# Patient Record
Sex: Female | Born: 1985 | Race: Black or African American | Hispanic: No | Smoking: Current every day smoker
Health system: Southern US, Community
[De-identification: ages and names within clinical notes are randomized; demographics above are authoritative.]

## PROBLEM LIST (undated history)

## (undated) ENCOUNTER — Inpatient Hospital Stay (HOSPITAL_COMMUNITY): Payer: Self-pay

## (undated) DIAGNOSIS — B999 Unspecified infectious disease: Secondary | ICD-10-CM

## (undated) HISTORY — PX: NO PAST SURGERIES: SHX2092

---
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
## 2007-02-02 ENCOUNTER — Inpatient Hospital Stay (HOSPITAL_COMMUNITY): Admission: AD | Admit: 2007-02-02 | Discharge: 2007-02-03 | Payer: Self-pay | Admitting: Obstetrics and Gynecology

## 2007-02-03 ENCOUNTER — Inpatient Hospital Stay (HOSPITAL_COMMUNITY): Admission: AD | Admit: 2007-02-03 | Discharge: 2007-02-03 | Payer: Self-pay | Admitting: Obstetrics and Gynecology

## 2007-02-04 ENCOUNTER — Inpatient Hospital Stay (HOSPITAL_COMMUNITY): Admission: AD | Admit: 2007-02-04 | Discharge: 2007-02-06 | Payer: Self-pay | Admitting: Obstetrics and Gynecology

## 2007-02-20 ENCOUNTER — Emergency Department (HOSPITAL_COMMUNITY): Admission: EM | Admit: 2007-02-20 | Discharge: 2007-02-20 | Payer: Self-pay | Admitting: Emergency Medicine

## 2007-08-03 ENCOUNTER — Emergency Department (HOSPITAL_COMMUNITY): Admission: EM | Admit: 2007-08-03 | Discharge: 2007-08-03 | Payer: Self-pay | Admitting: Emergency Medicine

## 2008-03-16 ENCOUNTER — Emergency Department (HOSPITAL_COMMUNITY): Admission: EM | Admit: 2008-03-16 | Discharge: 2008-03-17 | Payer: Self-pay | Admitting: Emergency Medicine

## 2008-06-05 ENCOUNTER — Emergency Department (HOSPITAL_COMMUNITY): Admission: EM | Admit: 2008-06-05 | Discharge: 2008-06-05 | Payer: Self-pay | Admitting: Emergency Medicine

## 2008-08-18 IMAGING — CR DG CERVICAL SPINE COMPLETE 4+V
6 series · 6 of 6 positions shown · non-contrast
Comparison: none

CLINICAL DATA: 21 year-old-female with neck pain and chest pain. 
 CERVICAL SPINE ? 5 VIEW

[view not recorded (1 of 6)]
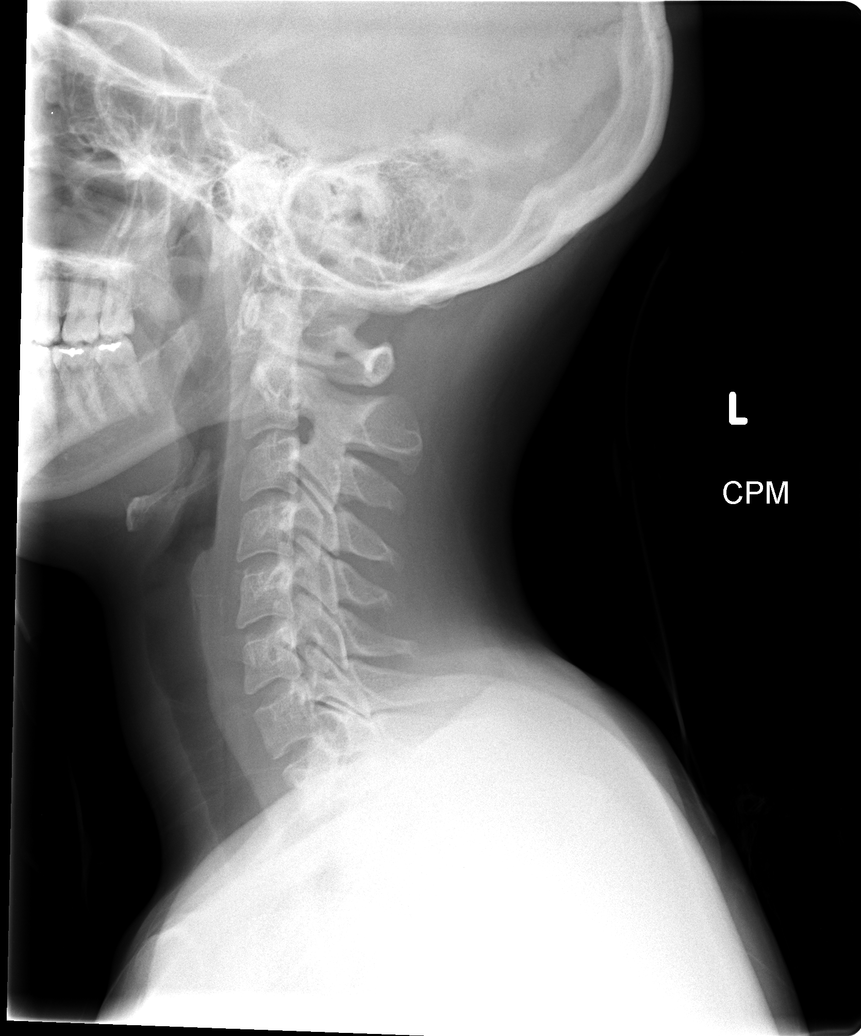

[view not recorded (2 of 6)]
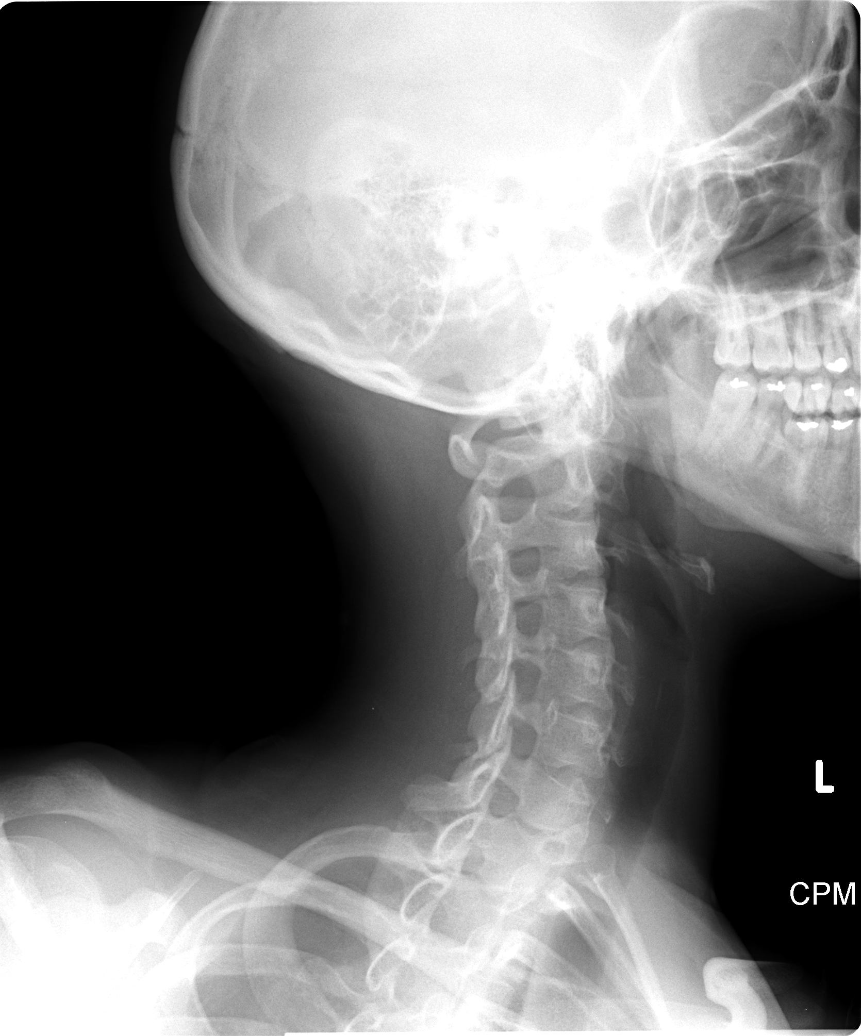

[view not recorded (3 of 6)]
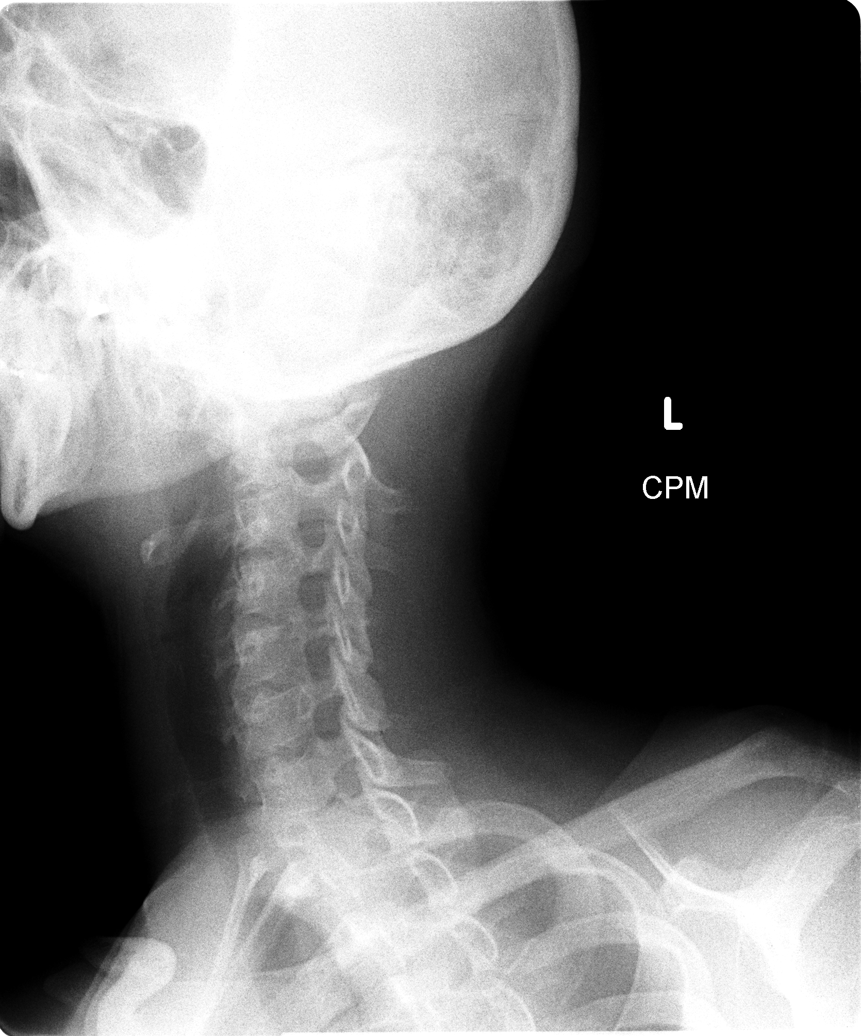

[view not recorded (4 of 6)]
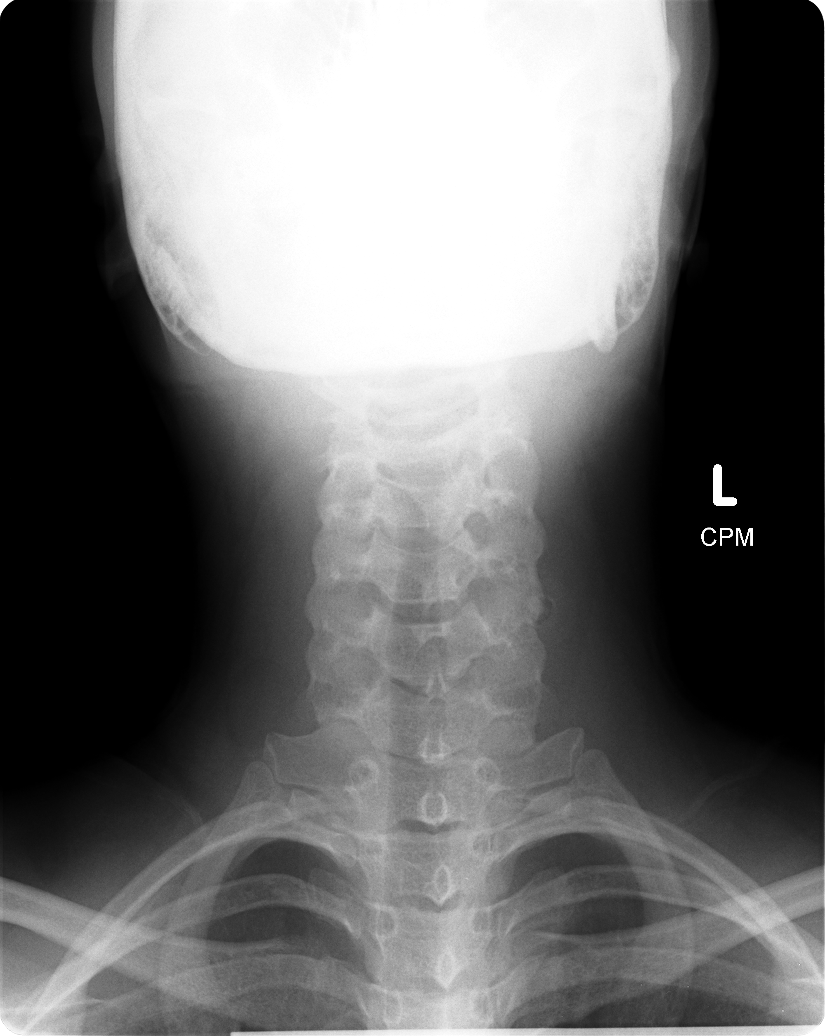

[view not recorded (5 of 6)]
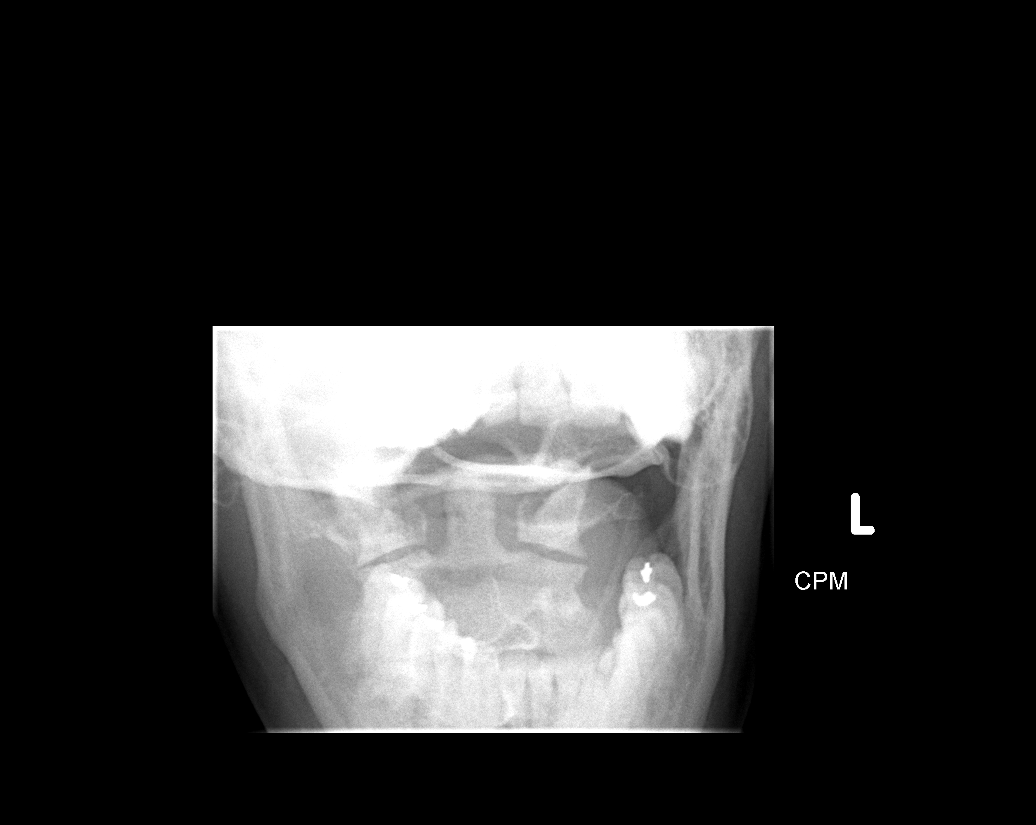

[view not recorded (6 of 6)]
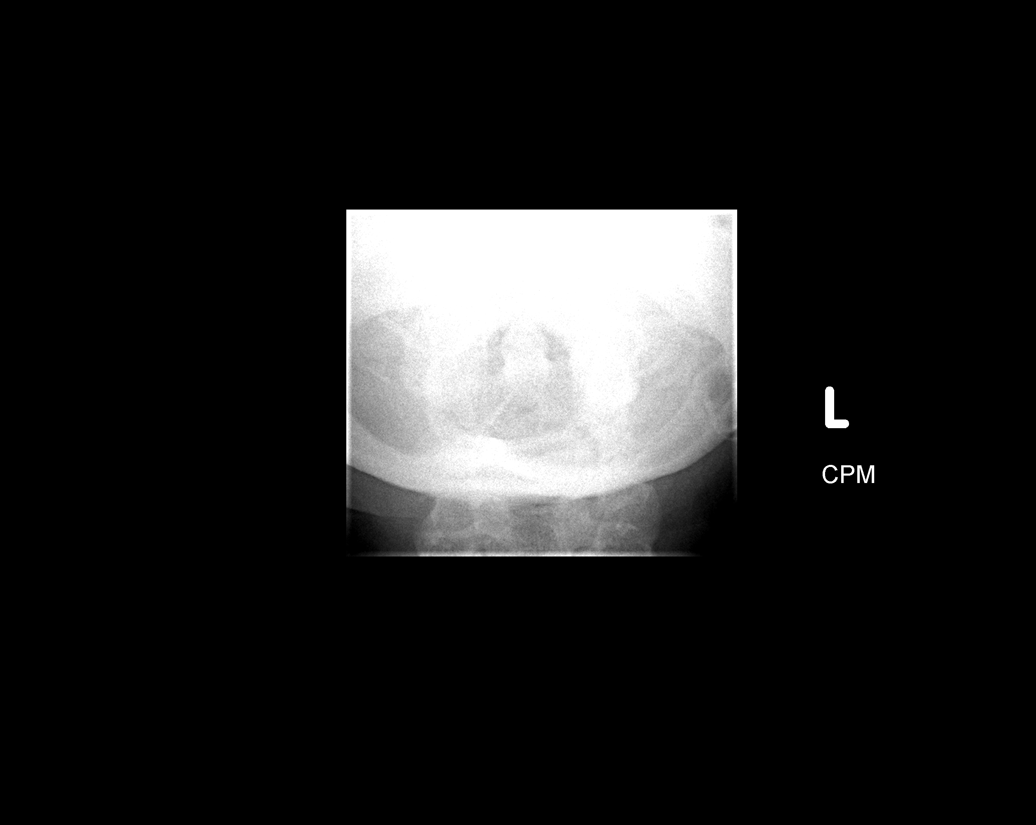

[6 of 6 positions shown; findings below may reference images not displayed]

FINDINGS: There is no evidence of cervical spine fracture or prevertebral soft tissue swelling.  Alignment is normal.  No other significant bone abnormalities are identified.
IMPRESSION: Negative cervical spine radiographs.

## 2008-11-04 ENCOUNTER — Emergency Department (HOSPITAL_COMMUNITY): Admission: EM | Admit: 2008-11-04 | Discharge: 2008-11-04 | Payer: Self-pay | Admitting: Physician Assistant

## 2008-11-08 ENCOUNTER — Emergency Department (HOSPITAL_COMMUNITY): Admission: EM | Admit: 2008-11-08 | Discharge: 2008-11-08 | Payer: Self-pay | Admitting: Emergency Medicine

## 2009-01-02 ENCOUNTER — Inpatient Hospital Stay (HOSPITAL_COMMUNITY): Admission: AD | Admit: 2009-01-02 | Discharge: 2009-01-02 | Payer: Self-pay | Admitting: Obstetrics and Gynecology

## 2009-08-01 ENCOUNTER — Inpatient Hospital Stay (HOSPITAL_COMMUNITY): Admission: AD | Admit: 2009-08-01 | Discharge: 2009-08-04 | Payer: Self-pay | Admitting: Obstetrics and Gynecology

## 2010-10-12 ENCOUNTER — Inpatient Hospital Stay (HOSPITAL_COMMUNITY): Admission: AD | Admit: 2010-10-12 | Discharge: 2010-10-12 | Payer: Self-pay | Admitting: Anesthesiology

## 2011-02-28 LAB — URINALYSIS, ROUTINE W REFLEX MICROSCOPIC
Ketones, ur: NEGATIVE mg/dL
Protein, ur: NEGATIVE mg/dL
Urobilinogen, UA: 0.2 mg/dL (ref 0.0–1.0)

## 2011-02-28 LAB — DIFFERENTIAL
Basophils Absolute: 0 10*3/uL (ref 0.0–0.1)
Eosinophils Relative: 1 % (ref 0–5)
Lymphocytes Relative: 22 % (ref 12–46)
Monocytes Absolute: 0.4 10*3/uL (ref 0.1–1.0)
Monocytes Relative: 7 % (ref 3–12)
Neutro Abs: 4.3 10*3/uL (ref 1.7–7.7)

## 2011-02-28 LAB — TYPE AND SCREEN
ABO/RH(D): B POS
Antibody Screen: NEGATIVE

## 2011-02-28 LAB — URINE MICROSCOPIC-ADD ON

## 2011-02-28 LAB — RUBELLA SCREEN: Rubella: 27.8 IU/mL — ABNORMAL HIGH

## 2011-02-28 LAB — RPR: RPR Ser Ql: NONREACTIVE

## 2011-02-28 LAB — CBC
HCT: 32.3 % — ABNORMAL LOW (ref 36.0–46.0)
Hemoglobin: 11.1 g/dL — ABNORMAL LOW (ref 12.0–15.0)
MCHC: 34.5 g/dL (ref 30.0–36.0)
MCV: 94.9 fL (ref 78.0–100.0)
RDW: 12.9 % (ref 11.5–15.5)
WBC: 6.1 10*3/uL (ref 4.0–10.5)

## 2011-02-28 LAB — GC/CHLAMYDIA PROBE AMP, GENITAL: Chlamydia, DNA Probe: NEGATIVE

## 2011-03-24 LAB — CBC
HCT: 36.1 % (ref 36.0–46.0)
Hemoglobin: 11.2 g/dL — ABNORMAL LOW (ref 12.0–15.0)
Hemoglobin: 12.4 g/dL (ref 12.0–15.0)
MCHC: 33.3 g/dL (ref 30.0–36.0)
MCHC: 33.7 g/dL (ref 30.0–36.0)
MCV: 84.5 fL (ref 78.0–100.0)
MCV: 85.2 fL (ref 78.0–100.0)
Platelets: 173 10*3/uL (ref 150–400)
RBC: 3.94 MIL/uL (ref 3.87–5.11)
RBC: 4.14 MIL/uL (ref 3.87–5.11)
RBC: 4.28 MIL/uL (ref 3.87–5.11)
RBC: 4.4 MIL/uL (ref 3.87–5.11)
RDW: 15 % (ref 11.5–15.5)
RDW: 15.9 % — ABNORMAL HIGH (ref 11.5–15.5)
WBC: 10.7 10*3/uL — ABNORMAL HIGH (ref 4.0–10.5)
WBC: 12.2 10*3/uL — ABNORMAL HIGH (ref 4.0–10.5)
WBC: 7.7 10*3/uL (ref 4.0–10.5)

## 2011-03-24 LAB — URINE MICROSCOPIC-ADD ON

## 2011-03-24 LAB — COMPREHENSIVE METABOLIC PANEL
ALT: 11 U/L (ref 0–35)
ALT: 14 U/L (ref 0–35)
ALT: 19 U/L (ref 0–35)
ALT: 24 U/L (ref 0–35)
AST: 31 U/L (ref 0–37)
AST: 39 U/L — ABNORMAL HIGH (ref 0–37)
Albumin: 2.5 g/dL — ABNORMAL LOW (ref 3.5–5.2)
Alkaline Phosphatase: 152 U/L — ABNORMAL HIGH (ref 39–117)
BUN: 3 mg/dL — ABNORMAL LOW (ref 6–23)
CO2: 22 mEq/L (ref 19–32)
CO2: 22 mEq/L (ref 19–32)
Calcium: 6.1 mg/dL — CL (ref 8.4–10.5)
Calcium: 8.3 mg/dL — ABNORMAL LOW (ref 8.4–10.5)
Chloride: 107 mEq/L (ref 96–112)
Chloride: 109 mEq/L (ref 96–112)
Creatinine, Ser: 0.75 mg/dL (ref 0.4–1.2)
GFR calc Af Amer: >60 mL/min (ref >60)
GFR calc non Af Amer: >60 mL/min (ref >60)
GFR calc non Af Amer: >60 mL/min (ref >60)
Glucose, Bld: 72 mg/dL (ref 70–99)
Glucose, Bld: 75 mg/dL (ref 70–99)
Glucose, Bld: 98 mg/dL (ref 70–99)
Potassium: 4.7 mEq/L (ref 3.5–5.1)
Sodium: 135 mEq/L (ref 135–145)
Sodium: 135 mEq/L (ref 135–145)
Sodium: 136 mEq/L (ref 135–145)
Sodium: 136 mEq/L (ref 135–145)
Total Bilirubin: 0.3 mg/dL (ref 0.3–1.2)
Total Protein: 5.8 g/dL — ABNORMAL LOW (ref 6.0–8.3)
Total Protein: 5.8 g/dL — ABNORMAL LOW (ref 6.0–8.3)

## 2011-03-24 LAB — URIC ACID
Uric Acid, Serum: 7.9 mg/dL — ABNORMAL HIGH (ref 2.4–7.0)
Uric Acid, Serum: 8.3 mg/dL — ABNORMAL HIGH (ref 2.4–7.0)
Uric Acid, Serum: 8.4 mg/dL — ABNORMAL HIGH (ref 2.4–7.0)

## 2011-03-24 LAB — URINALYSIS, ROUTINE W REFLEX MICROSCOPIC
Glucose, UA: NEGATIVE mg/dL
Nitrite: NEGATIVE
Specific Gravity, Urine: 1.025 (ref 1.005–1.030)
pH: 6.5 (ref 5.0–8.0)

## 2011-03-24 LAB — LACTATE DEHYDROGENASE
LDH: 230 U/L (ref 94–250)
LDH: 321 U/L — ABNORMAL HIGH (ref 94–250)

## 2011-03-24 LAB — RPR: RPR Ser Ql: NONREACTIVE

## 2011-04-02 LAB — CBC
MCHC: 34 g/dL (ref 30.0–36.0)
MCV: 89.6 fL (ref 78.0–100.0)
RDW: 13.1 % (ref 11.5–15.5)

## 2011-04-02 LAB — ABO/RH: ABO/RH(D): B POS

## 2011-04-02 LAB — HCG, QUANTITATIVE, PREGNANCY: hCG, Beta Chain, Quant, S: 42353 m[IU]/mL — ABNORMAL HIGH (ref <5)

## 2011-05-01 NOTE — Discharge Summary (Signed)
[name], Selena Kaiser NO.:  [id number]   MEDICAL RECORD NO.:  [id number]          PATIENT TYPE:  INP   LOCATION:  9101                          [facility]   PHYSICIAN:  Huel Cote, M.D. DATE OF BIRTH:  Apr 22, 1986   DATE OF ADMISSION:  08/01/2009  DATE OF DISCHARGE:  08/04/2009                               DISCHARGE SUMMARY   DISCHARGE DIAGNOSES:  1. Preterm pregnancy at 36-1/7th weeks, delivered.  2. Severe preeclampsia.  3. Status post normal spontaneous vaginal delivery precipitous.   DISCHARGE MEDICATIONS:  1. Labetalol 200 mg p.o. twice daily.  2. Prenatal vitamins 1 p.o. daily.  3. Motrin 600 mg p.o. every 6 hours p.r.n.   DISCHARGE FOLLOWUP:  The patient plans to follow up in stated wherever  [place]   HOSPITAL COURSE:  The patient is a 25 year old, G2, P 1-0-0-1, who came  2 maternity admissions at 36 weeks and 1/7th days with estimated due  date of August 29, 2009, by a 6-week ultrasound.  She had come in for  some concerns of swelling.  She was visiting from [place] for a  baby shower and had not been seen for prenatal care in [place] since  June 2010.  Upon presentation, the patient was noted to have excessively  elevated blood pressures 170-200 over 119-121 and 3+ proteinuria  consistent with severe preeclampsia.  She denied any headaches or vision  changes or other symptoms.  As stated, her prenatal care had been  somewhat scanty.  She had 4 visits with our office from 22-28 weeks and  then reported that she moved to [place] and would receive care  there.  She has not seen a doctor in [place] since her leaving  [place] in mid June 2010.  The prenatal care that she did have had no  significant issues.   PRENATAL LABORATORY DATA:  B positive, antibody negative, rubella  immune, RPR nonreactive, HIV negative, GC negative, she was chlamydia  positive, but was treated with  a negative test of cure.  1-hour Glucola  was 92.  She was too far along for any genetic screens upon presentation  and her group B strep was unknown.   PAST OBSTETRICAL HISTORY:  In 2008, she had a vaginal delivery of a 5  pounds 13 ounces infant.   PAST GYN HISTORY:  History of chlamydia as stated and as well as  Trichomonas, which were both treated with negative test of cures.   PAST SURGICAL HISTORY:  None.   PAST MEDICAL HISTORY:  None.   ALLERGIES:  None.   MEDICATIONS:  None.   PHYSICAL EXAMINATION:  VITAL SIGNS:  On admission, as stated her blood  pressures were elevated at 170-119.  Fetal heart rate was reactive.  CARDIAC:  Regular rate and rhythm.  LUNGS:  Clear.  ABDOMEN:  Gravid and nontender.  Cervix was 53 and -2 station.  She had  2-3 plus edema and 2+ DTRs bilaterally.   Labs were performed with a uric acid noted at 8.3, otherwise LFTs were  normal at  29 and 11, AST and ALT respectively.  Creatinine was 0.75 and  her platelets were 174.  She was treated with IV labetalol and her blood  pressures improved to diastolics of 90-100.  At that point, Cervix was  53 and -2.  Rupture of membranes performed with clear fluid noted and  she was placed on magnesium and Pitocin as well as penicillin for an  unknown Group B strep status.  Within 4 hours of rupture of membranes,  the patient received an epidural went from 5 cm to 10 cm and delivery  within about 15 minutes and I was called for delivery, as the patient  delivered with nursing staff in attendance.  The baby did quite well.  Apgars were 8 and 9, weight was 5 pounds 3 ounces.  The midwife was able  to enter the room shortly after delivery, while I was arriving at the  hospital, and the placenta delivered spontaneously and the cervix,  vagina, and perineum were all intact.  Estimated blood loss was 300 mL.  The patient was stable at that point with blood pressures 140/95 on her  magnesium and was left on her  continuous infusion.  Overnight, she did  have some elevated blood pressures up to 160/126, which were covered  with IV labetalol.  The following morning, she was diuresing extremely  well with over 5000 mL in that shift.  She was changed to p.o. labetalol  200 mg p.o. twice daily and was continued on her magnesium for a full 24  hours.  This was then discontinued and she continued to diurese very  well.  Her LFTs did bump slightly to 53 and 24 AST and ALT and her  platelets were 145 on postpartum day #2.  By postpartum day #3, her  blood pressures were stable at 140s over 90s on her p.o. labetalol.  She  was doing quite well.  Her pain was well controlled.  Bleeding was  normal.  Platelets were stable on day of discharge at 144 and LFTs had  begun to normalize at 32 and 12.  Therefore, she was felt stable for  discharge home.  She was discharged on her p.o. labetalol as stated.  Careful instructions were given to the patient regarding her need for  followup of blood pressures to wean off her blood pressure medicine.  She was advised to see someone within 2 weeks of discharge to be  evaluated and she stated that she would do this in [place].  I  advised her to get a copy of her discharge summary to assist those  physicians in her care and she stated she would do this prior to  returning home at the end of the week.      Huel Cote, M.D.  Electronically Signed     KR/MEDQ  D:  08/04/2009  T:  08/04/2009  Job:  [id number]

## 2011-05-04 NOTE — Discharge Summary (Signed)
NAME:  Selena, Kaiser NO.:  [id number]   MEDICAL RECORD NO.:  [id number]          PATIENT TYPE:  INP   LOCATION:  9143                          [facility]ILITY:  [facility]   PHYSICIAN:  Sherron Monday, MD        DATE OF BIRTH:  [birth year]   DATE OF ADMISSION:  02/04/2007  DATE OF DISCHARGE:  02/06/2007                               DISCHARGE SUMMARY   ADMISSION DIAGNOSIS:  Intrauterine pregnancy at term, early labor.   DISCHARGE DIAGNOSIS:  Intrauterine pregnancy at term, early labor,  delivered by spontaneous vaginal delivery.   HISTORY OF PRESENT ILLNESS:  Selena Kaiser is a 25 year old, G1 at 37-3/7  weeks who presents with contractions that have been increasing in  intensity and frequency. She states that she has good fetal movement, no  loss of fluid, no vaginal bleeding and is having contractions at least  every 5 minutes that are taking her breath away and making her hurt. Her  EDC is 02/22/07.  She was evaluated last evening in the maternity  admissions unit and had reactive tracing and regular contractions,  however, no cervical change. She was discharged to home at this time and  she has returned early in the morning of the 19th.   PAST MEDICAL HISTORY:  Not significant.   PAST SURGICAL HISTORY:  Not significant.   PAST OB/GYN HISTORY:  GYN significant for present pregnancy, no  complications. She has no history of abnormal pap smears. She has a  history of Chlamydia with this pregnancy.   MEDICATIONS:  Prenatal vitamins and iron.   ALLERGIES:  No known drug allergies.   SOCIAL HISTORY:  The patient denies alcohol, tobacco or drug use. She is  single.   FAMILY HISTORY:  Significant for coronary artery disease in maternal  grandfather.  Diabetes in maternal grandfather as well as end-stage  renal disease, hypertension in maternal grandfather, mother and brother.   PRENATAL LABORATORY DATA:  Hemoglobin 10.4, platelet count 363,000, B  positive, antibody screen  negative.  Sickle cell is within normal  limits. Gonorrhea negative. Chlamydia positive with several tests of  cure that were positive and she was recently treated again. She states  that since this time she has not been sexually active. RPR nonreactive,  rubella immune, hepatitis B surface antigen negative, HIV negative.  Glucola 95.  Group B Streptococcus was positive.  AFP was within normal  limits.  GBBS negative. Ultrasound performed on 10/28/06 at 23 weeks and  two days revealed an normal anatomy, anterior placenta and a female  infant. At admission her examination was benign.  Fetal heart tones in  the 140s to 150s and reactive contractions were 2-4 minutes. Vaginal  examination has changed from 1 to 2, 80 and -2 to 3 cm, 80, and -2.  She  progressed rapidly to complete labor, pushed well to deliver a viable  female infant at 7:44 on the morning of the 19th with Apgar's of 7 at  one minute and 9 at five minutes, weight of 5 pounds 13 ounces. The  infant was delivered OA to ROT.  Placenta was manually extracted at  7:48.  With the delivery she had bilateral labial lacerations as well as  a periclitoral laceration and a first degree perineal laceration. These  were all repaired with 3-0 Vicryl.  EBL was less than 500 cc.  She will  get a dose of Cefotetan secondary to the manual extraction of the  placenta.  Postpartum course was relatively uncomplicated. She remained  afebrile with stable vital signs throughout.  On postpartum day two, she  had no complaints, had normal lochia and her pain was well controlled.  She was ambulating well and voiding without difficulty. She was  discharged to home at this time. Her hemoglobin had decreased from 11.9  to 10.8. She was discharged to home with Motrin, Vicodin and prenatal  vitamins as well as routine discharge instructions and the number to  call for any questions or problems. She will be following up with [place] in  six weeks.   DISCHARGE  INFORMATION:  B positive, rubella immune, hemoglobin 11.9 to  10.8.  She plans to bottle feed and will start either pills or  __________ at six weeks check up.      Sherron Monday, MD  Electronically Signed     JB/MEDQ  D:  02/06/2007  T:  02/06/2007  Job:  [id number]

## 2011-09-11 LAB — POCT PREGNANCY, URINE: Preg Test, Ur: NEGATIVE

## 2011-09-11 LAB — COMPREHENSIVE METABOLIC PANEL
ALT: 19
AST: 22
Albumin: 3.6
Alkaline Phosphatase: 71
CO2: 23
Chloride: 107
Creatinine, Ser: 0.62
GFR calc Af Amer: >60
GFR calc non Af Amer: >60
Potassium: 3.5
Sodium: 138
Total Bilirubin: 0.4

## 2011-09-11 LAB — DIFFERENTIAL
Basophils Absolute: 0
Basophils Relative: 0
Eosinophils Absolute: 0.1
Eosinophils Relative: 2
Lymphocytes Relative: 48 — ABNORMAL HIGH
Monocytes Absolute: 0.3

## 2011-09-11 LAB — URINALYSIS, ROUTINE W REFLEX MICROSCOPIC
Bilirubin Urine: NEGATIVE
Hgb urine dipstick: NEGATIVE
Ketones, ur: NEGATIVE
Nitrite: NEGATIVE
Urobilinogen, UA: 1
pH: 7

## 2011-09-11 LAB — CBC
MCV: 86.2
Platelets: 252
RBC: 4.27
WBC: 3.9 — ABNORMAL LOW

## 2011-09-11 LAB — URINE MICROSCOPIC-ADD ON

## 2012-12-17 NOTE — L&D Delivery Note (Signed)
Delivery Note Pt progressed rapidly to complete dilation.  At 11:50 AM a healthy female was delivered via Vaginal, Spontaneous Delivery (Presentation:LOA).  APGAR: 9, 9; weight 5 lb 14.4 oz (2675 g).   Placenta status: Intact, Spontaneous.  Cord: 3 vessels with the following complications: nuchal cord delivered through. Anesthesia: Epidural  Episiotomy: None Lacerations: None Suture Repair: N/A Est. Blood Loss (mL): 350cc  Mom to postpartum.  Baby to stay with mother. D/w pt circumcision risks and benefits and she would like to proceed.  D/w pt unable to get [employer] to give authorization for tubal.  She would like to eat anyway and is amenable to doing an Essure postpartum as long as she gets Depo-provera prior to d/c.  We will set that up instead of the pp BTL.  Oliver Pila 05/13/2013, 12:11 PM

## 2013-03-12 ENCOUNTER — Encounter (HOSPITAL_COMMUNITY): Payer: Self-pay | Admitting: *Deleted

## 2013-03-12 ENCOUNTER — Inpatient Hospital Stay (HOSPITAL_COMMUNITY)
Admission: AD | Admit: 2013-03-12 | Discharge: 2013-03-12 | Disposition: A | Discharge status: Home / Self Care | Source: Ambulatory Visit | Attending: Obstetrics and Gynecology | Admitting: Obstetrics and Gynecology

## 2013-03-12 DIAGNOSIS — Z3201 Encounter for pregnancy test, result positive: Secondary | ICD-10-CM | POA: Insufficient documentation

## 2013-03-12 LAB — POCT PREGNANCY, URINE: Preg Test, Ur: POSITIVE — AB

## 2013-03-12 NOTE — MAU Note (Addendum)
Pt refuses lab draw. Pt states that she only wants know how far a long she is. Friend @ bedside states that the patient is planning on ending the pregnancy.

## 2013-03-12 NOTE — MAU Provider Note (Signed)
  History     CSN: [id number]  Arrival date and time: 03/12/13 1834   None     Chief Complaint  Patient presents with  . Possible Pregnancy   HPI  Selena Kaiser is a 27 y.o. [id number] who presents today to verify if she is in fact pregnant and to know how far along she is. She denies any pain or vaginal bleeding. LMP: 12/05/12.   No past medical history on file.  No past surgical history on file.  No family history on file.  History  Substance Use Topics  . Smoking status: Current Every Day Smoker -- 1.00 packs/day    Types: Cigarettes  . Smokeless tobacco: Not on file  . Alcohol Use: No    Allergies: No Known Allergies  No prescriptions prior to admission    Review of Systems  Constitutional: Negative for fever and chills.  Eyes: Negative for blurred vision.  Gastrointestinal: Negative for nausea, vomiting, abdominal pain, diarrhea and constipation.  Genitourinary: Negative for dysuria, urgency and frequency.  Musculoskeletal: Negative for myalgias.  Neurological: Negative for dizziness and headaches.   Physical Exam   Blood pressure 127/82, pulse 82, temperature 98.2 F (36.8 C), temperature source Oral, resp. rate 16, height 5' 3.5" (1.613 m), weight 63.141 kg (139 lb 3.2 oz), last menstrual period 12/05/2012, SpO2 100.00%.  Physical Exam  Nursing note and vitals reviewed. Constitutional: She is oriented to person, place, and time. She appears well-developed and well-nourished. No distress.  Cardiovascular: Normal rate.   Respiratory: Effort normal.  GI: Soft.  Genitourinary:  Pt refuses pelvic exam today. She just wants a letter for verification of pregnancy.   Neurological: She is alert and oriented to person, place, and time.  Skin: Skin is warm and dry.  Psychiatric: She has a normal mood and affect.    MAU Course  Procedures  Results for orders placed during the hospital encounter of 03/12/13 (from the past 24 hour(s))  POCT PREGNANCY, URINE      Status: Abnormal   Collection Time    03/12/13  7:03 PM      Result Value Range   Preg Test, Ur POSITIVE (*) NEGATIVE   Assessment and Plan   1. Pregnancy test performed, pregnancy confirmed    Pregnancy verification letter given Danger signs reviewed, knows she can return to MAU if needed.   Tawnya Crook 03/12/2013, 8:23 PM

## 2013-03-12 NOTE — MAU Note (Signed)
Pt reports that she had a period in December, but not in January. Pt then reports heavy bleeding in February. Pt took a home pregnancy test in January and it was positive. Pt denies pain or bleeding at this time.

## 2013-03-12 NOTE — MAU Note (Signed)
Patient states she has had some spotting off and on, none now. Denies pain. Would like to have a pregnancy test.

## 2013-03-13 NOTE — MAU Provider Note (Signed)
Attestation of Attending Supervision of Advanced Practitioner (CNM/NP): Evaluation and management procedures were performed by the Advanced Practitioner under my supervision and collaboration.  I have reviewed the Advanced Practitioner's note and chart, and I agree with the management and plan.  Emiya Loomer 03/13/2013 8:10 AM

## 2013-03-23 ENCOUNTER — Encounter (HOSPITAL_COMMUNITY): Payer: Self-pay | Admitting: *Deleted

## 2013-03-23 ENCOUNTER — Inpatient Hospital Stay (HOSPITAL_COMMUNITY)

## 2013-03-23 ENCOUNTER — Inpatient Hospital Stay (HOSPITAL_COMMUNITY)
Admission: AD | Admit: 2013-03-23 | Discharge: 2013-03-23 | Disposition: A | Discharge status: Home / Self Care | Source: Ambulatory Visit | Attending: Obstetrics & Gynecology | Admitting: Obstetrics & Gynecology

## 2013-03-23 DIAGNOSIS — R1032 Left lower quadrant pain: Secondary | ICD-10-CM | POA: Insufficient documentation

## 2013-03-23 DIAGNOSIS — O26899 Other specified pregnancy related conditions, unspecified trimester: Secondary | ICD-10-CM

## 2013-03-23 DIAGNOSIS — O99891 Other specified diseases and conditions complicating pregnancy: Secondary | ICD-10-CM | POA: Insufficient documentation

## 2013-03-23 DIAGNOSIS — R109 Unspecified abdominal pain: Secondary | ICD-10-CM

## 2013-03-23 DIAGNOSIS — B9689 Other specified bacterial agents as the cause of diseases classified elsewhere: Secondary | ICD-10-CM

## 2013-03-23 HISTORY — DX: Unspecified infectious disease: B99.9

## 2013-03-23 LAB — WET PREP, GENITAL
Trich, Wet Prep: NONE SEEN
Yeast Wet Prep HPF POC: NONE SEEN

## 2013-03-23 LAB — URINALYSIS, ROUTINE W REFLEX MICROSCOPIC
Glucose, UA: NEGATIVE mg/dL
Hgb urine dipstick: NEGATIVE
Specific Gravity, Urine: 1.02 (ref 1.005–1.030)
pH: 7.5 (ref 5.0–8.0)

## 2013-03-23 LAB — URINE MICROSCOPIC-ADD ON

## 2013-03-23 MED ORDER — METRONIDAZOLE 500 MG PO TABS: 500.0000 mg | ORAL_TABLET | Freq: Two times a day (BID) | ORAL | Status: DC

## 2013-03-23 MED ORDER — ACETAMINOPHEN 500 MG PO TABS
1000.0000 mg | ORAL_TABLET | Freq: Four times a day (QID) | ORAL | Status: DC | PRN
  Administered 2013-03-23: 1000 mg via ORAL
  Filled 2013-03-23: qty 2

## 2013-03-23 NOTE — MAU Provider Note (Signed)
Attestation of Attending Supervision of Advanced Practitioner (CNM/NP): Evaluation and management procedures were performed by the Advanced Practitioner under my supervision and collaboration.  I have reviewed the Advanced Practitioner's note and chart, and I agree with the management and plan.  HARRAWAY-SMITH, Daneka Lantigua 10:05 PM     

## 2013-03-23 NOTE — MAU Provider Note (Signed)
History     CSN: [id number]  Arrival date and time: 03/23/13 1519   None     Chief Complaint  Patient presents with  . Abdominal Pain   HPI 27 y.o. [id number] at Unknown with pain all over abdomen, achy. Started last night on LLQ while lying down. Constant. Nothing makes it better or worse. No nausea, vomiting, diarrhea, constipation. No fever/chills. No pain with urination. No vaginal bleeding. Had watery discharge last night, felt like liquid flowing out but did not look at it, nothing on underwear or bedsheets.  LMP  Unknown. Seen in MAU 03/12/13 but wasn't sure she wanted to keep baby at that time.    OB History   Grav Para Term Preterm Abortions TAB SAB Ect Mult Living   5 4 4       4     All vaginal deliveries.   Past Medical History  Diagnosis Date  . Infection     UTI    Past Surgical History  Procedure Laterality Date  . No past surgeries      Family History  Problem Relation Age of Onset  . Hypertension Mother   . Cancer Maternal Aunt     lung  . Hypertension Maternal Grandmother   . Cancer Maternal Grandmother     lung  . Cancer Paternal Grandmother     History  Substance Use Topics  . Smoking status: Current Every Day Smoker -- 0.25 packs/day for 4 years    Types: Cigarettes  . Smokeless tobacco: Not on file  . Alcohol Use: No    Allergies: No Known Allergies  No prescriptions prior to admission    Review of Systems  Constitutional: Negative for fever and chills.  Eyes: Negative for blurred vision and double vision.  Respiratory: Negative for shortness of breath.   Cardiovascular: Negative for chest pain.  Gastrointestinal: Positive for abdominal pain. Negative for heartburn, nausea, vomiting, diarrhea and constipation.  Genitourinary: Negative for dysuria and urgency.  Musculoskeletal: Negative for back pain.  Neurological: Negative for dizziness and headaches.   Physical Exam   Blood pressure 132/91, pulse 88, temperature 98 F (36.7  C), temperature source Oral, resp. rate 16, height 5\' 4"  (1.626 m), weight 63.866 kg (140 lb 12.8 oz).  Physical Exam  Constitutional: She is oriented to person, place, and time. She appears well-developed and well-nourished. No distress.  HENT:  Head: Normocephalic and atraumatic.  Eyes: Conjunctivae and EOM are normal.  Neck: Normal range of motion. Neck supple.  Cardiovascular: Normal rate, regular rhythm and normal heart sounds.   Respiratory: Breath sounds normal. She is in respiratory distress.  GI: Soft. Bowel sounds are normal. She exhibits no distension. There is no rebound and no guarding.  Gravid, FH about 24-25 cm. Diffuse mild tenderness. No guarding or rebound.  Musculoskeletal: Normal range of motion. She exhibits no edema and no tenderness.  Neurological: She is alert and oriented to person, place, and time.  Skin: Skin is warm and dry.  Psychiatric: She has a normal mood and affect.   FHTs:   140, moderate variability, accles present, no decels TOCO:  Occasional cts.  Results for orders placed during the hospital encounter of 03/23/13 (from the past 24 hour(s))  URINALYSIS, ROUTINE W REFLEX MICROSCOPIC     Status: Abnormal   Collection Time    03/23/13  3:40 PM      Result Value Range   Color, Urine YELLOW  YELLOW   APPearance HAZY (*) CLEAR  Specific Gravity, Urine 1.020  1.005 - 1.030   pH 7.5  5.0 - 8.0   Glucose, UA NEGATIVE  NEGATIVE mg/dL   Hgb urine dipstick NEGATIVE  NEGATIVE   Bilirubin Urine NEGATIVE  NEGATIVE   Ketones, ur 40 (*) NEGATIVE mg/dL   Protein, ur NEGATIVE  NEGATIVE mg/dL   Urobilinogen, UA 1.0  0.0 - 1.0 mg/dL   Nitrite NEGATIVE  NEGATIVE   Leukocytes, UA LARGE (*) NEGATIVE  URINE MICROSCOPIC-ADD ON     Status: Abnormal   Collection Time    03/23/13  3:40 PM      Result Value Range   Squamous Epithelial / LPF MANY (*) RARE   WBC, UA 21-50  <3 WBC/hpf   Bacteria, UA MANY (*) RARE  WET PREP, GENITAL     Status: Abnormal   Collection  Time    03/23/13  4:50 PM      Result Value Range   Yeast Wet Prep HPF POC NONE SEEN  NONE SEEN   Trich, Wet Prep NONE SEEN  NONE SEEN   Clue Cells Wet Prep HPF POC FEW (*) NONE SEEN   WBC, Wet Prep HPF POC MANY (*) NONE SEEN   ULTRASOUND  LIMITED OBSTETRIC ULTRASOUND  Number of Fetuses: 1  Heart Rate: 140 bpm  Movement: Yes.  Presentation: Cephalic.  Placental Location: Anterior.  Previa: No.  Amniotic Fluid (Subjective): Normal.  Vertical Pocket 6.2 cm  BPD: 6.8 cm 27 w 4 d EDC: 06/18/2013.  MATERNAL FINDINGS:  Cervix: Closed.  Uterus/Adnexae: No acute findings.  IMPRESSION:  1. Single living intrauterine pregnancy without complicating  feature.  2. In this patient with no prenatal care and unsure of dates,  gestational age is approximately 27 weeks 4 days with an estimated  date of confinement of 06/18/2013.   MAU Course  Procedures   Assessment and Plan  27 y.o. [id number] at unknown GA with abdominal pain - no acute process on ultrasound - pain improved with tylenol - FHTs reactive - f/u with [facility] to establish care  Napoleon Form 03/23/2013, 4:28 PM

## 2013-03-23 NOTE — MAU Note (Signed)
Patient states she has been having abdominal pain all over the abdomen since last night but worse today. States she has been feeling fetal movement. Denies bleeding, discharge or leaking.

## 2013-03-24 LAB — GC/CHLAMYDIA PROBE AMP: CT Probe RNA: NEGATIVE

## 2013-03-25 ENCOUNTER — Inpatient Hospital Stay (HOSPITAL_COMMUNITY)

## 2013-03-25 ENCOUNTER — Observation Stay (HOSPITAL_COMMUNITY)
Admission: AD | Admit: 2013-03-25 | Discharge: 2013-03-26 | Disposition: A | Discharge status: Home / Self Care | Source: Ambulatory Visit | Attending: Obstetrics & Gynecology | Admitting: Obstetrics & Gynecology

## 2013-03-25 ENCOUNTER — Encounter (HOSPITAL_COMMUNITY): Payer: Self-pay

## 2013-03-25 DIAGNOSIS — E876 Hypokalemia: Secondary | ICD-10-CM

## 2013-03-25 DIAGNOSIS — W1809XA Striking against other object with subsequent fall, initial encounter: Secondary | ICD-10-CM | POA: Insufficient documentation

## 2013-03-25 DIAGNOSIS — D649 Anemia, unspecified: Secondary | ICD-10-CM

## 2013-03-25 DIAGNOSIS — O9989 Other specified diseases and conditions complicating pregnancy, childbirth and the puerperium: Secondary | ICD-10-CM

## 2013-03-25 DIAGNOSIS — O99891 Other specified diseases and conditions complicating pregnancy: Principal | ICD-10-CM | POA: Insufficient documentation

## 2013-03-25 DIAGNOSIS — Y92009 Unspecified place in unspecified non-institutional (private) residence as the place of occurrence of the external cause: Secondary | ICD-10-CM | POA: Insufficient documentation

## 2013-03-25 LAB — URINE CULTURE
Colony Count: NO GROWTH
Culture: NO GROWTH

## 2013-03-25 LAB — URINE MICROSCOPIC-ADD ON

## 2013-03-25 LAB — COMPREHENSIVE METABOLIC PANEL
AST: 14 U/L (ref 0–37)
CO2: 22 mEq/L (ref 19–32)
Calcium: 8 mg/dL — ABNORMAL LOW (ref 8.4–10.5)
Creatinine, Ser: 0.42 mg/dL — ABNORMAL LOW (ref 0.50–1.10)
GFR calc Af Amer: >90 mL/min (ref >90)
GFR calc non Af Amer: >90 mL/min (ref >90)
Glucose, Bld: 86 mg/dL (ref 70–99)
Total Protein: 5.9 g/dL — ABNORMAL LOW (ref 6.0–8.3)

## 2013-03-25 LAB — URINALYSIS, ROUTINE W REFLEX MICROSCOPIC
Bilirubin Urine: NEGATIVE
Ketones, ur: NEGATIVE mg/dL
Nitrite: NEGATIVE
Protein, ur: 30 mg/dL — AB
pH: 6.5 (ref 5.0–8.0)

## 2013-03-25 LAB — CBC
Hemoglobin: 8.6 g/dL — ABNORMAL LOW (ref 12.0–15.0)
RBC: 3.01 MIL/uL — ABNORMAL LOW (ref 3.87–5.11)

## 2013-03-25 MED ORDER — POTASSIUM CHLORIDE CRYS ER 10 MEQ PO TBCR
40.0000 meq | EXTENDED_RELEASE_TABLET | Freq: Once | ORAL | Status: AC
  Administered 2013-03-26: 40 meq via ORAL
  Filled 2013-03-25: qty 4

## 2013-03-25 MED ORDER — SODIUM CHLORIDE 0.9 % IV SOLN
INTRAVENOUS | Status: DC
  Administered 2013-03-25: 19:00:00 via INTRAVENOUS

## 2013-03-25 MED ORDER — CEPHALEXIN 500 MG PO CAPS
500.0000 mg | ORAL_CAPSULE | Freq: Three times a day (TID) | ORAL | Status: DC
  Administered 2013-03-25 – 2013-03-26 (×2): 500 mg via ORAL
  Filled 2013-03-25 (×3): qty 1

## 2013-03-25 MED ORDER — ACETAMINOPHEN 325 MG PO TABS: 650.0000 mg | ORAL_TABLET | ORAL | Status: DC | PRN

## 2013-03-25 MED ORDER — PRENATAL MULTIVITAMIN CH: 1.0000 | ORAL_TABLET | Freq: Every day | ORAL | Status: DC

## 2013-03-25 MED ORDER — POTASSIUM CHLORIDE 10 MEQ/100ML IV SOLN
10.0000 meq | INTRAVENOUS | Status: AC
  Administered 2013-03-25 (×2): 10 meq via INTRAVENOUS
  Filled 2013-03-25 (×2): qty 100

## 2013-03-25 MED ORDER — SODIUM CHLORIDE 0.9 % IV SOLN
INTRAVENOUS | Status: DC
  Administered 2013-03-26: 01:00:00 via INTRAVENOUS

## 2013-03-25 MED ORDER — DOCUSATE SODIUM 100 MG PO CAPS
100.0000 mg | ORAL_CAPSULE | Freq: Every day | ORAL | Status: DC
  Administered 2013-03-26: 100 mg via ORAL
  Filled 2013-03-25: qty 1

## 2013-03-25 MED ORDER — FERUMOXYTOL INJECTION 510 MG/17 ML
510.0000 mg | Freq: Once | INTRAVENOUS | Status: AC
Start: 2013-03-25 — End: 2013-03-25
  Administered 2013-03-25: 510 mg via INTRAVENOUS
  Filled 2013-03-25: qty 17

## 2013-03-25 MED ORDER — CALCIUM CARBONATE ANTACID 500 MG PO CHEW: 2.0000 | CHEWABLE_TABLET | ORAL | Status: DC | PRN

## 2013-03-25 MED ORDER — POTASSIUM CHLORIDE 10 MEQ/100ML IV SOLN
10.0000 meq | INTRAVENOUS | Status: DC
  Filled 2013-03-25 (×4): qty 100

## 2013-03-25 MED ORDER — ACETAMINOPHEN 325 MG PO TABS
650.0000 mg | ORAL_TABLET | Freq: Once | ORAL | Status: AC
  Administered 2013-03-25: 650 mg via ORAL
  Filled 2013-03-25: qty 2

## 2013-03-25 MED ORDER — POTASSIUM CHLORIDE CRYS ER 10 MEQ PO TBCR
40.0000 meq | EXTENDED_RELEASE_TABLET | Freq: Once | ORAL | Status: AC
  Administered 2013-03-25: 40 meq via ORAL
  Filled 2013-03-25: qty 4

## 2013-03-25 MED ORDER — ZOLPIDEM TARTRATE 5 MG PO TABS
5.0000 mg | ORAL_TABLET | Freq: Every evening | ORAL | Status: DC | PRN
  Administered 2013-03-25: 5 mg via ORAL
  Filled 2013-03-25: qty 1

## 2013-03-25 NOTE — MAU Note (Signed)
Patient is in with c/o generalized pain since fall onto abdomen at 1300pm. Patient states that she was on the commode and as she was standing up she fell. She denies vaginal bleeding or lof. She reports good fetal movement. Patient states that her first OB appt with [facility] is tomorrow 03/26/2013

## 2013-03-25 NOTE — H&P (Signed)
Selena Kaiser is a 27 y.o. [id number] at [redacted]w[redacted]d who presents to the MAU today for evaluation after falling on her stomach.   Was using the bathroom and when she stood up she fell directly on her stomach. This occurred around 1:00pm. Had immediate pain. Pain is the same now, has not lessened off at all. Fell directly on the front of her stomach. No bleeding, fluid leaking. Does feel baby move, the last time was a few minutes ago. Feels pressure in her pelvic area but no contractions. Also fell on her face. She woke up on the floor. Does not know how long it took her to wake up. Does not know why she fell. Denies having any palpitations or chest pain prior to falling. Didn't slip or trip on anything. It got black after she fell. No one was with her. Her father in law was home but not in the room with her. Her mother brought her here. No loss of bladder function. Denies having a history of seizures. No vomiting. Head is hurting now just a little bit. No numbness or weakness anywhere. No problems with vision or speech. Also complains of pain in her legs since falling, the pain is located on the inner part of her thighs.   No problems during this pregnancy so far, just hurting. Monday she hurt in her side on the left. Hasn't gone in yet for prenatal care. Has appointment tomorrow [facility].   History OB History   Grav Para Term Preterm Abortions TAB SAB Ect Mult Living   5 4 4       4      Has four kids, they are all doing well. Vaginal deliveries. No problems during those pregnancies or deliveries.   Past Medical History  Diagnosis Date  . Infection     UTI   No medical problems. Does not take any medicines. No allergies to food or medicine. No history of surgery.  Past Surgical History  Procedure Laterality Date  . No past surgeries     Family History: family history includes Cancer in her maternal aunt, maternal grandmother, and paternal grandmother and Hypertension in her maternal  grandmother and mother.  Social History: Smokes two cigarettes per day for four years. No alcohol or drugs.   Prenatal Transfer Tool  Maternal Diabetes: No Genetic Screening: Declined - too late Maternal Ultrasounds/Referrals: Normal Fetal Ultrasounds or other Referrals:  None Maternal Substance Abuse:  Yes:  Type: Smoker Significant Maternal Medications:  None Significant Maternal Lab Results:  Lab values include: Other: hypokalemia, anemia Other Comments:  None  ROS per HPI - endorses headache, denies vision changes or speech problems. Denies weakness or numbness. Denies chest pain. Endorses some shortness of breath which occurred after falling.    Blood pressure 119/70, pulse 78, temperature 98.2 F (36.8 C), temperature source Oral, resp. rate 16, last menstrual period 12/05/2012, SpO2 99.00%. Exam Physical Exam   Gen: NAD, tearful  HEENT: no facial trauma present Heart: RRR  Lungs: CTAB  Abd: gravid, diffusely mildly tender to palpation, some suprapubic tenderness  Ext: no pitting edema appreciable bilaterally  Neuro: PERRL, EOMI, face symmetric, grip 5/5 bilaterally, lower ext 5/5 bilaterally, shoulder shrug intact, face sensation to light touch intact bilaterally  Prenatal labs:has not had any labs, has not yet established care ABO, Rh:   Antibody:   Rubella:   RPR:    HBsAg:    HIV:    GBS:     Comprehensive Metabolic Panel:  Component  Value  Date/Time    NA  134*  03/25/2013 1610    K  2.8*  03/25/2013 1610    CL  102  03/25/2013 1610    CO2  22  03/25/2013 1610    BUN  4*  03/25/2013 1610    CREATININE  0.42*  03/25/2013 1610    GLUCOSE  86  03/25/2013 1610    CALCIUM  8.0*  03/25/2013 1610    AST  14  03/25/2013 1610    ALT  7  03/25/2013 1610    ALKPHOS  74  03/25/2013 1610    BILITOT  0.3  03/25/2013 1610    PROT  5.9*  03/25/2013 1610    ALBUMIN  2.6*  03/25/2013 1610    CBC:    Component  Value  Date/Time    WBC  5.8  03/25/2013 1610    HGB  8.6*  03/25/2013 1610     HCT  25.5*  03/25/2013 1610    PLT  163  03/25/2013 1610    MCV  84.7  03/25/2013 1610    NEUTROABS  4.3  10/12/2010 1401    LYMPHSABS  1.4  10/12/2010 1401    MONOABS  0.4  10/12/2010 1401    EOSABS  0.1  10/12/2010 1401    BASOSABS  0.0  10/12/2010 1401    Urinalysis    Component  Value  Date/Time    COLORURINE  YELLOW  03/25/2013 1448    APPEARANCEUR  HAZY*  03/25/2013 1448    LABSPEC  1.020  03/25/2013 1448    PHURINE  6.5  03/25/2013 1448    GLUCOSEU  NEGATIVE  03/25/2013 1448    HGBUR  TRACE*  03/25/2013 1448    BILIRUBINUR  NEGATIVE  03/25/2013 1448    KETONESUR  NEGATIVE  03/25/2013 1448    PROTEINUR  30*  03/25/2013 1448    UROBILINOGEN  1.0  03/25/2013 1448    NITRITE  NEGATIVE  03/25/2013 1448    LEUKOCYTESUR  MODERATE*  03/25/2013 1448    Urine Micro: few squamous cells, 21-50 WBC, 0-2 RBC, few bacteria   Assessment/Plan:  Selena Kaiser is a 27 y.o. [id number] at [redacted]w[redacted]d who presents today s/p fall of unknown etiology. Neuro exam is nonfocal. Abdominal exam shows diffuse mild tenderness, worse in suprapubic area.   MAU course: For initial workup, will obtain CMET, CBC, and ultrasound to evaluate for abruption. Tylenol for pain.  Update 5:45pm:  Labwork significant for fairly profound hypokalemia (2.8) as well as anemia (hgb 8.6). EKG shows T wave inversion in v1-v3 but normal sinus rhythm, normal axis. Ultrasound shows no abruption. Urine suggestive of UTI. Patient continues to complain of pain which was not relieved by tylenol.  Possible etiologies in this patient include orthostatic hypotension secondary to dehydration, vasovagal syncope, or (less likely) cardiac arrhythmia. Seizure very unlikely given no loss of bladder function and no prior history of seizure.  Plan: -Admit to AICU (needs to go there for telemetry monitoring) -check TSH, mag level, orthostatic vital signs for syncope workup -treat UTI with keflex, follow up on urine culture -replete potassium with 10 mEq IV x 4 followed by  PO K-dur -hydrate with NS @ 125 cc/hr, regular diet -recheck BMET in the morning -monitor patient on telemetry and continuous tocometer, monitor fetus on continuous fetal heart tracing -will consider utility of head CT, although it is reassuring that patient has a normal neuro exam and we do want  to limit exposure of fetus to radiation   Levert Feinstein 03/25/2013, 7:22 PM   I examined pt and agree with documentation above and resident plan of care. [facility]

## 2013-03-26 LAB — BASIC METABOLIC PANEL
CO2: 20 mEq/L (ref 19–32)
Calcium: 7.7 mg/dL — ABNORMAL LOW (ref 8.4–10.5)
GFR calc non Af Amer: >90 mL/min (ref >90)
Potassium: 3.2 mEq/L — ABNORMAL LOW (ref 3.5–5.1)
Sodium: 135 mEq/L (ref 135–145)

## 2013-03-26 LAB — MRSA PCR SCREENING: MRSA by PCR: NEGATIVE

## 2013-03-26 LAB — RPR: RPR Ser Ql: NONREACTIVE

## 2013-03-26 LAB — URINE CULTURE
Colony Count: NO GROWTH
Culture: NO GROWTH

## 2013-03-26 LAB — MAGNESIUM: Magnesium: 1.6 mg/dL (ref 1.5–2.5)

## 2013-03-26 MED ORDER — CEPHALEXIN 500 MG PO CAPS: 500.0000 mg | ORAL_CAPSULE | Freq: Three times a day (TID) | ORAL | Status: DC

## 2013-03-26 MED ORDER — POTASSIUM CHLORIDE CRYS ER 20 MEQ PO TBCR
40.0000 meq | EXTENDED_RELEASE_TABLET | ORAL | Status: AC
  Administered 2013-03-26 (×2): 40 meq via ORAL
  Filled 2013-03-26 (×2): qty 2

## 2013-03-26 MED ORDER — POTASSIUM CHLORIDE CRYS ER 20 MEQ PO TBCR: 20.0000 meq | EXTENDED_RELEASE_TABLET | Freq: Every day | ORAL | Status: DC

## 2013-03-26 NOTE — Progress Notes (Signed)
UR chart review completed.  

## 2013-03-26 NOTE — Discharge Summary (Signed)
Physician Discharge Summary  Patient ID: Selena Kaiser MRN: [id number] DOB/AGE: 1986/10/21 27 y.o.  Admit date: 03/25/2013 Discharge date: 03/26/2013  Admission Diagnoses: Hypokalemia 28 weeks Discharge Diagnoses:  same  Discharged Condition: stable  Hospital Course: unremarkable  Consults: None  Significant Diagnostic Studies: labs: 2.8  Treatments: IV potassium  Discharge Exam: Blood pressure 126/78, pulse 81, temperature 98.5 F (36.9 C), temperature source Oral, resp. rate 19, height 5\' 4"  (1.626 m), weight 139 lb 6.4 oz (63.231 kg), last menstrual period 12/05/2012, SpO2 100.00%. General appearance: alert, cooperative and no distress GI: soft, non-tender; bowel sounds normal; no masses,  no organomegaly  Disposition: 01-Home or Self Care  Discharge Orders   Future Orders Complete By Expires     Call MD for:  temperature >100.4  As directed     Diet - low sodium heart healthy  As directed         Medication List    TAKE these medications       cephALEXin 500 MG capsule  Commonly known as:  KEFLEX  Take 1 capsule (500 mg total) by mouth every 8 (eight) hours.     potassium chloride SA 20 MEQ tablet  Commonly known as:  K-DUR,KLOR-CON  Take 1 tablet (20 mEq total) by mouth daily. Take 1 tablet  3 times a day for 1 week, 1 tablet 2 times a day for 1 week and finish out at 1 a day.     prenatal multivitamin Tabs  Take 1 tablet by mouth daily at 12 noon.           Follow-up Information   Follow up with [facility] In 1 week.   Contact information:   [address] [place] [address] [phone]      Signed: Lazaro Arms 03/26/2013, 7:27 AM

## 2013-03-27 ENCOUNTER — Other Ambulatory Visit: Payer: Self-pay | Admitting: Family

## 2013-03-27 MED ORDER — INTEGRA F 125-1 MG PO CAPS: 1.0000 | ORAL_CAPSULE | Freq: Every day | ORAL | Status: DC

## 2013-03-27 NOTE — Progress Notes (Signed)
RX for Integra sent to [facility] on [address] > called pt and left message.

## 2013-05-12 ENCOUNTER — Encounter (HOSPITAL_COMMUNITY): Payer: Self-pay

## 2013-05-12 ENCOUNTER — Inpatient Hospital Stay (HOSPITAL_COMMUNITY)
Admission: AD | Admit: 2013-05-12 | Discharge: 2013-05-15 | DRG: 775 | Disposition: A | Discharge status: Home / Self Care | Source: Ambulatory Visit | Attending: Obstetrics and Gynecology | Admitting: Obstetrics and Gynecology

## 2013-05-12 DIAGNOSIS — O99892 Other specified diseases and conditions complicating childbirth: Secondary | ICD-10-CM | POA: Diagnosis present

## 2013-05-12 DIAGNOSIS — R030 Elevated blood-pressure reading, without diagnosis of hypertension: Secondary | ICD-10-CM | POA: Diagnosis present

## 2013-05-12 DIAGNOSIS — O0933 Supervision of pregnancy with insufficient antenatal care, third trimester: Secondary | ICD-10-CM

## 2013-05-12 DIAGNOSIS — O09893 Supervision of other high risk pregnancies, third trimester: Secondary | ICD-10-CM

## 2013-05-12 NOTE — MAU Note (Signed)
Pt states UC began around 4pm today and are about 5 mins apart. Denies LOF and vaginal bleeding.

## 2013-05-13 ENCOUNTER — Inpatient Hospital Stay (HOSPITAL_COMMUNITY): Admitting: Anesthesiology

## 2013-05-13 ENCOUNTER — Encounter (HOSPITAL_COMMUNITY): Payer: Self-pay | Admitting: Advanced Practice Midwife

## 2013-05-13 ENCOUNTER — Encounter (HOSPITAL_COMMUNITY): Payer: Self-pay | Admitting: Anesthesiology

## 2013-05-13 DIAGNOSIS — O09299 Supervision of pregnancy with other poor reproductive or obstetric history, unspecified trimester: Secondary | ICD-10-CM | POA: Insufficient documentation

## 2013-05-13 DIAGNOSIS — O09899 Supervision of other high risk pregnancies, unspecified trimester: Secondary | ICD-10-CM | POA: Insufficient documentation

## 2013-05-13 DIAGNOSIS — O0930 Supervision of pregnancy with insufficient antenatal care, unspecified trimester: Secondary | ICD-10-CM | POA: Insufficient documentation

## 2013-05-13 LAB — COMPREHENSIVE METABOLIC PANEL
ALT: 9 U/L (ref 0–35)
ALT: 8 U/L (ref 0–35)
AST: 16 U/L (ref 0–37)
Albumin: 3 g/dL — ABNORMAL LOW (ref 3.5–5.2)
Alkaline Phosphatase: 156 U/L — ABNORMAL HIGH (ref 39–117)
Alkaline Phosphatase: 147 U/L — ABNORMAL HIGH (ref 39–117)
BUN: 3 mg/dL — ABNORMAL LOW (ref 6–23)
CO2: 22 mEq/L (ref 19–32)
Calcium: 8.4 mg/dL (ref 8.4–10.5)
Chloride: 102 mEq/L (ref 96–112)
Chloride: 104 mEq/L (ref 96–112)
GFR calc Af Amer: >90 mL/min (ref >90)
GFR calc non Af Amer: >90 mL/min (ref >90)
Glucose, Bld: 81 mg/dL (ref 70–99)
Potassium: 3.1 mEq/L — ABNORMAL LOW (ref 3.5–5.1)
Sodium: 136 mEq/L (ref 135–145)
Total Bilirubin: 0.3 mg/dL (ref 0.3–1.2)
Total Bilirubin: 0.4 mg/dL (ref 0.3–1.2)

## 2013-05-13 LAB — CBC
HCT: 32.3 % — ABNORMAL LOW (ref 36.0–46.0)
Hemoglobin: 11.1 g/dL — ABNORMAL LOW (ref 12.0–15.0)
Hemoglobin: 10.7 g/dL — ABNORMAL LOW (ref 12.0–15.0)
MCH: 29.8 pg (ref 26.0–34.0)
Platelets: 167 10*3/uL (ref 150–400)
RBC: 3.59 MIL/uL — ABNORMAL LOW (ref 3.87–5.11)
RDW: 16.1 % — ABNORMAL HIGH (ref 11.5–15.5)
WBC: 7.5 10*3/uL (ref 4.0–10.5)
WBC: 7.9 10*3/uL (ref 4.0–10.5)

## 2013-05-13 LAB — GROUP B STREP BY PCR: Group B strep by PCR: NEGATIVE

## 2013-05-13 LAB — URINALYSIS, ROUTINE W REFLEX MICROSCOPIC
Bilirubin Urine: NEGATIVE
Glucose, UA: NEGATIVE mg/dL
Glucose, UA: NEGATIVE mg/dL
Hgb urine dipstick: NEGATIVE
Ketones, ur: NEGATIVE mg/dL
Ketones, ur: NEGATIVE mg/dL
Leukocytes, UA: NEGATIVE
Protein, ur: NEGATIVE mg/dL
Protein, ur: NEGATIVE mg/dL
pH: 6.5 (ref 5.0–8.0)

## 2013-05-13 LAB — URINE MICROSCOPIC-ADD ON

## 2013-05-13 LAB — PROTEIN / CREATININE RATIO, URINE: Creatinine, Urine: 14.73 mg/dL

## 2013-05-13 LAB — OB RESULTS CONSOLE GBS: GBS: NEGATIVE

## 2013-05-13 LAB — RPR: RPR Ser Ql: NONREACTIVE

## 2013-05-13 MED ORDER — NALBUPHINE SYRINGE 5 MG/0.5 ML
10.0000 mg | INJECTION | Freq: Once | INTRAMUSCULAR | Status: AC
  Administered 2013-05-13: 10 mg via INTRAVENOUS
  Filled 2013-05-13: qty 1

## 2013-05-13 MED ORDER — POTASSIUM CHLORIDE CRYS ER 20 MEQ PO TBCR
40.0000 meq | EXTENDED_RELEASE_TABLET | Freq: Once | ORAL | Status: AC
  Administered 2013-05-13: 40 meq via ORAL
  Filled 2013-05-13: qty 2

## 2013-05-13 MED ORDER — DIPHENHYDRAMINE HCL 50 MG/ML IJ SOLN
12.5000 mg | INTRAMUSCULAR | Status: DC | PRN
  Administered 2013-05-13: 12.5 mg via INTRAVENOUS
  Filled 2013-05-13: qty 1

## 2013-05-13 MED ORDER — EPHEDRINE 5 MG/ML INJ
10.0000 mg | INTRAVENOUS | Status: DC | PRN
  Filled 2013-05-13: qty 4

## 2013-05-13 MED ORDER — IBUPROFEN 600 MG PO TABS
600.0000 mg | ORAL_TABLET | Freq: Four times a day (QID) | ORAL | Status: DC
  Administered 2013-05-13 – 2013-05-15 (×8): 600 mg via ORAL
  Filled 2013-05-13 (×8): qty 1

## 2013-05-13 MED ORDER — FENTANYL CITRATE 0.05 MG/ML IJ SOLN
50.0000 ug | INTRAMUSCULAR | Status: DC | PRN
  Administered 2013-05-13: 50 ug via INTRAVENOUS
  Filled 2013-05-13: qty 2

## 2013-05-13 MED ORDER — TETANUS-DIPHTH-ACELL PERTUSSIS 5-2.5-18.5 LF-MCG/0.5 IM SUSP: 0.5000 mL | Freq: Once | INTRAMUSCULAR | Status: DC

## 2013-05-13 MED ORDER — PRENATAL MULTIVITAMIN CH
1.0000 | ORAL_TABLET | Freq: Every day | ORAL | Status: DC
  Administered 2013-05-13 – 2013-05-15 (×3): 1 via ORAL
  Filled 2013-05-13 (×3): qty 1

## 2013-05-13 MED ORDER — SIMETHICONE 80 MG PO CHEW: 80.0000 mg | CHEWABLE_TABLET | ORAL | Status: DC | PRN

## 2013-05-13 MED ORDER — OXYCODONE-ACETAMINOPHEN 5-325 MG PO TABS: 1.0000 | ORAL_TABLET | ORAL | Status: DC | PRN

## 2013-05-13 MED ORDER — LACTATED RINGERS IV SOLN: 500.0000 mL | INTRAVENOUS | Status: DC | PRN

## 2013-05-13 MED ORDER — DIPHENHYDRAMINE HCL 25 MG PO CAPS
25.0000 mg | ORAL_CAPSULE | Freq: Four times a day (QID) | ORAL | Status: DC | PRN
  Administered 2013-05-13: 25 mg via ORAL
  Filled 2013-05-13: qty 1

## 2013-05-13 MED ORDER — BENZOCAINE-MENTHOL 20-0.5 % EX AERO: 1.0000 "application " | INHALATION_SPRAY | CUTANEOUS | Status: DC | PRN

## 2013-05-13 MED ORDER — MEDROXYPROGESTERONE ACETATE 150 MG/ML IM SUSP
150.0000 mg | INTRAMUSCULAR | Status: AC | PRN
  Administered 2013-05-15: 150 mg via INTRAMUSCULAR
  Filled 2013-05-13: qty 1

## 2013-05-13 MED ORDER — LACTATED RINGERS IV SOLN: INTRAVENOUS | Status: DC

## 2013-05-13 MED ORDER — OXYTOCIN 40 UNITS IN LACTATED RINGERS INFUSION - SIMPLE MED: 1.0000 m[IU]/min | INTRAVENOUS | Status: DC

## 2013-05-13 MED ORDER — FENTANYL 2.5 MCG/ML BUPIVACAINE 1/10 % EPIDURAL INFUSION (WH - ANES)
14.0000 mL/h | INTRAMUSCULAR | Status: DC | PRN
  Filled 2013-05-13: qty 125

## 2013-05-13 MED ORDER — OXYTOCIN 40 UNITS IN LACTATED RINGERS INFUSION - SIMPLE MED
62.5000 mL/h | INTRAVENOUS | Status: DC
  Administered 2013-05-13: 999 mL/h via INTRAVENOUS
  Filled 2013-05-13: qty 1000

## 2013-05-13 MED ORDER — PENICILLIN G POTASSIUM 5000000 UNITS IJ SOLR
5.0000 10*6.[IU] | Freq: Once | INTRAVENOUS | Status: AC
  Administered 2013-05-13: 5 10*6.[IU] via INTRAVENOUS
  Filled 2013-05-13: qty 5

## 2013-05-13 MED ORDER — FLEET ENEMA 7-19 GM/118ML RE ENEM: 1.0000 | ENEMA | RECTAL | Status: DC | PRN

## 2013-05-13 MED ORDER — ONDANSETRON HCL 4 MG PO TABS: 4.0000 mg | ORAL_TABLET | ORAL | Status: DC | PRN

## 2013-05-13 MED ORDER — ACETAMINOPHEN 325 MG PO TABS: 650.0000 mg | ORAL_TABLET | ORAL | Status: DC | PRN

## 2013-05-13 MED ORDER — SENNOSIDES-DOCUSATE SODIUM 8.6-50 MG PO TABS
2.0000 | ORAL_TABLET | Freq: Every day | ORAL | Status: DC
  Administered 2013-05-13 – 2013-05-14 (×2): 2 via ORAL

## 2013-05-13 MED ORDER — DIBUCAINE 1 % RE OINT: 1.0000 "application " | TOPICAL_OINTMENT | RECTAL | Status: DC | PRN

## 2013-05-13 MED ORDER — OXYTOCIN BOLUS FROM INFUSION: 500.0000 mL | INTRAVENOUS | Status: DC

## 2013-05-13 MED ORDER — OXYCODONE-ACETAMINOPHEN 5-325 MG PO TABS
1.0000 | ORAL_TABLET | ORAL | Status: DC | PRN
Start: 2013-05-13 — End: 2013-05-15
  Administered 2013-05-14: 1 via ORAL
  Filled 2013-05-13: qty 1

## 2013-05-13 MED ORDER — EPHEDRINE 5 MG/ML INJ: 10.0000 mg | INTRAVENOUS | Status: DC | PRN

## 2013-05-13 MED ORDER — LANOLIN HYDROUS EX OINT: TOPICAL_OINTMENT | CUTANEOUS | Status: DC | PRN

## 2013-05-13 MED ORDER — PHENYLEPHRINE 40 MCG/ML (10ML) SYRINGE FOR IV PUSH (FOR BLOOD PRESSURE SUPPORT): 80.0000 ug | PREFILLED_SYRINGE | INTRAVENOUS | Status: DC | PRN

## 2013-05-13 MED ORDER — WITCH HAZEL-GLYCERIN EX PADS: 1.0000 "application " | MEDICATED_PAD | CUTANEOUS | Status: DC | PRN

## 2013-05-13 MED ORDER — LACTATED RINGERS IV SOLN
INTRAVENOUS | Status: DC
  Administered 2013-05-13: 250 mL/h via INTRAVENOUS

## 2013-05-13 MED ORDER — LACTATED RINGERS IV SOLN: 500.0000 mL | Freq: Once | INTRAVENOUS | Status: DC

## 2013-05-13 MED ORDER — TERBUTALINE SULFATE 1 MG/ML IJ SOLN: 0.2500 mg | Freq: Once | INTRAMUSCULAR | Status: DC | PRN

## 2013-05-13 MED ORDER — ONDANSETRON HCL 4 MG/2ML IJ SOLN: 4.0000 mg | Freq: Four times a day (QID) | INTRAMUSCULAR | Status: DC | PRN

## 2013-05-13 MED ORDER — LIDOCAINE HCL (PF) 1 % IJ SOLN
INTRAMUSCULAR | Status: DC | PRN
  Administered 2013-05-13 (×2): 8 mL

## 2013-05-13 MED ORDER — PHENYLEPHRINE 40 MCG/ML (10ML) SYRINGE FOR IV PUSH (FOR BLOOD PRESSURE SUPPORT)
80.0000 ug | PREFILLED_SYRINGE | INTRAVENOUS | Status: DC | PRN
  Filled 2013-05-13: qty 5

## 2013-05-13 MED ORDER — ONDANSETRON HCL 4 MG/2ML IJ SOLN: 4.0000 mg | INTRAMUSCULAR | Status: DC | PRN

## 2013-05-13 MED ORDER — FENTANYL 2.5 MCG/ML BUPIVACAINE 1/10 % EPIDURAL INFUSION (WH - ANES)
INTRAMUSCULAR | Status: DC | PRN
  Administered 2013-05-13: 14 mL/h via EPIDURAL

## 2013-05-13 MED ORDER — CITRIC ACID-SODIUM CITRATE 334-500 MG/5ML PO SOLN: 30.0000 mL | ORAL | Status: DC | PRN

## 2013-05-13 MED ORDER — IBUPROFEN 600 MG PO TABS: 600.0000 mg | ORAL_TABLET | Freq: Four times a day (QID) | ORAL | Status: DC | PRN

## 2013-05-13 MED ORDER — LACTATED RINGERS IV BOLUS (SEPSIS)
1000.0000 mL | Freq: Once | INTRAVENOUS | Status: AC
  Administered 2013-05-13: 1000 mL via INTRAVENOUS

## 2013-05-13 MED ORDER — ZOLPIDEM TARTRATE 5 MG PO TABS: 5.0000 mg | ORAL_TABLET | Freq: Every evening | ORAL | Status: DC | PRN

## 2013-05-13 MED ORDER — PENICILLIN G POTASSIUM 5000000 UNITS IJ SOLR
2.5000 10*6.[IU] | INTRAVENOUS | Status: DC
  Filled 2013-05-13 (×3): qty 2.5

## 2013-05-13 MED ORDER — LIDOCAINE HCL (PF) 1 % IJ SOLN
30.0000 mL | INTRAMUSCULAR | Status: DC | PRN
  Filled 2013-05-13: qty 30

## 2013-05-13 NOTE — Progress Notes (Signed)
  Subjective: Pt comfortable with epidural.  Denies HA or PIH sx  Objective: BP 140/86  Pulse 86  Temp(Src) 97.6 F (36.4 C) (Oral)  Resp 20  Ht 5\' 5"  (1.651 m)  Wt 65.59 kg (144 lb 9.6 oz)  BMI 24.06 kg/m2  SpO2 98%  LMP 12/05/2012   Total I/O In: -  Out: 300 [Urine:300]  FHT:  FHR: 145 bpm, variability: moderate,  accelerations:  Present,  decelerations:  Absent UC:   regular, every 2-4 minutes SVE:   Dilation: 5 Effacement (%): 70 Station: -2 Exam by:: Dr. Senaida Ores AROM bloody fluid  Labs: Lab Results  Component Value Date   WBC 7.5 05/13/2013   HGB 11.1* 05/13/2013   HCT 32.3* 05/13/2013   MCV 87.1 05/13/2013   PLT 164 05/13/2013    Assessment / Plan: Pt has had some elevated BP since admission.  130/90-100  Currently 140/80's.  PIH labs were WNL on admission, will recheck this AM and urine for protein.  No symptoms. D/w pt possible need for NICU stay in a preterm infant.  Dating is suboptimal from a 28 week [place].  Pt desires permanent sterilization.  We discussed in detail options of pp BTL and Essure.  She understands these to be permanent methods of sterilization and not easily reversible.  After consideration she wold like a pp BTL.  Will check benefits with [employer].  We discussed surgical risk and risk of failure of 1/100, with increased risk of ectopic should pregnancy occur.  She desires to proceed.  Oliver Pila 05/13/2013, 8:55 AM

## 2013-05-13 NOTE — Anesthesia Postprocedure Evaluation (Signed)
  Anesthesia Post-op Note  Patient: Selena Kaiser  Procedure(s) Performed: * No procedures listed *  Patient Location: PACU and Mother/Baby  Anesthesia Type:Epidural  Level of Consciousness: awake, alert , oriented and patient cooperative  Airway and Oxygen Therapy: Patient Spontanous Breathing  Post-op Pain: none  Post-op Assessment: Post-op Vital signs reviewed, Patient's Cardiovascular Status Stable and Respiratory Function Stable  Post-op Vital Signs: Reviewed and stable  Complications: No apparent anesthesia complications

## 2013-05-13 NOTE — Anesthesia Preprocedure Evaluation (Signed)

## 2013-05-13 NOTE — H&P (Signed)
Selena Kaiser is a 27 y.o. female, EGA [redacted]W[redacted]D presenting for evaluation of ctx.  Eval in MAU with reg ctx, cervix changed from 1 to 3 cm.  Pt admitted and has received an epidural.  Prenatal care complicated by late care at 28 weeks, declined 17-P for h/o 2 preterm deliveries, also with a h/o preeclampsia.  See prenatal records for complete history.  Maternal Medical History:  Reason for admission: Contractions.   Contractions: Frequency: regular.   Perceived severity is moderate.    Fetal activity: Perceived fetal activity is normal.      OB History   Grav Para Term Preterm Abortions TAB SAB Ect Mult Living   5 4 2 2  0 0 0 0 0 4    SVD x 4  Past Medical History  Diagnosis Date  . Infection     UTI   Past Surgical History  Procedure Laterality Date  . No past surgeries     Family History: family history includes Cancer in her maternal aunt, maternal grandmother, and paternal grandmother and Hypertension in her maternal grandmother and mother. Social History:  reports that she has been smoking Cigarettes.  She has a 1 pack-year smoking history. She uses smokeless tobacco. She reports that she does not drink alcohol or use illicit drugs.   Prenatal Transfer Tool  Maternal Diabetes: No Genetic Screening: Declined Maternal Ultrasounds/Referrals: Normal Fetal Ultrasounds or other Referrals:  None Maternal Substance Abuse:  No Significant Maternal Medications:  None Significant Maternal Lab Results:  None Other Comments:  PTL, rapid GBS is neg  Review of Systems  Respiratory: Negative.   Cardiovascular: Negative.     Dilation: 4 Effacement (%): 40 Station: -2 Exam by:: DR Tavious Griesinger Blood pressure 140/86, pulse 86, temperature 97.6 F (36.4 C), temperature source Oral, resp. rate 20, height 5\' 5"  (1.651 m), weight 65.59 kg (144 lb 9.6 oz), last menstrual period 12/05/2012, SpO2 98.00%. Maternal Exam:  Uterine Assessment: Contraction strength is moderate.  Contraction  frequency is regular.   Abdomen: Patient reports no abdominal tenderness. Estimated fetal weight is 6 lbs.   Fetal presentation: vertex  Introitus: Normal vulva. Normal vagina.  Amniotic fluid character: not assessed.  Pelvis: adequate for delivery.   Cervix: Cervix evaluated by digital exam.     Fetal Exam Fetal Monitor Review: Mode: ultrasound.   Baseline rate: 130.  Variability: moderate (6-25 bpm).   Pattern: accelerations present and no decelerations.    Fetal State Assessment: Category I - tracings are normal.     Physical Exam  Constitutional: She appears well-developed and well-nourished.  Cardiovascular: Normal rate, regular rhythm and normal heart sounds.   No murmur heard. Respiratory: Effort normal and breath sounds normal. No respiratory distress. She has no wheezes.  GI: Soft.  gravid    Prenatal labs: ABO, Rh:  B pos Antibody:  neg Rubella:  Imm RPR: NON REACTIVE (04/10 0451)  HBsAg: NEGATIVE (04/10 0451)  HIV: NON REACTIVE (04/10 0451)  GBS: Negative (05/28 0000)  GCT:  106  Assessment/Plan: IUP at 34+ weeks in preterm labor.  She has progressed to 4 cm, comfortable with epidural.  Received one dose of IV PCN while waiting for rapid GBS which is negative.  Will monitor progress.   Myonna Chisom D 05/13/2013, 8:44 AM

## 2013-05-13 NOTE — MAU Provider Note (Signed)
Chief Complaint:  Contractions  First Provider Initiated Contact with Patient 05/13/13 0004     HPI: Selena Kaiser is a 27 y.o. [id number] at [redacted]w[redacted]d by 27.4 week [place] who presents to maternity admissions reporting contractions since 1600. Rates pain 8/10. Denies leakage of fluid, vaginal bleeding, HA, vision changes, epigastric pain. Good fetal movement.   Patient Active Problem List   Diagnosis Date Noted  . Short interval between pregnancies complicating pregnancy, antepartum 05/13/2013  . Insufficient prenatal care 05/13/2013  . History of preterm delivery, currently pregnant 05/13/2013  . Hx of preeclampsia, prior pregnancy, currently pregnant 05/13/2013    Past Medical History: Past Medical History  Diagnosis Date  . Infection     UTI    Past obstetric history:     OB History   Grav Para Term Preterm Abortions TAB SAB Ect Mult Living   5 4 2 2  0 0 0 0 0 4     # Outc Date GA Lbr Len/2nd Wgt Sex Del Anes PTL Lv   1 TRM 2008 [redacted]w[redacted]d   F SVD EPI  Yes   2 PRE 2010 [redacted]w[redacted]d   M SVD EPI  Yes   3 PRE 2012 [redacted]w[redacted]d   F SVD   Yes   4 TRM 2013 [redacted]w[redacted]d   M SVD EPI  Yes   5 CUR               Past Surgical History: Past Surgical History  Procedure Laterality Date  . No past surgeries      Family History: Family History  Problem Relation Age of Onset  . Hypertension Mother   . Cancer Maternal Aunt     lung  . Hypertension Maternal Grandmother   . Cancer Maternal Grandmother     lung  . Cancer Paternal Grandmother     Social History: History  Substance Use Topics  . Smoking status: Current Every Day Smoker -- 0.25 packs/day for 4 years    Types: Cigarettes  . Smokeless tobacco: Not on file  . Alcohol Use: No    Allergies: No Known Allergies  Meds:  Prescriptions prior to admission  Medication Sig Dispense Refill  . cephALEXin (KEFLEX) 500 MG capsule Take 1 capsule (500 mg total) by mouth every 8 (eight) hours.  21 capsule  0  . Fe Fum-FePoly-FA-Vit C-Vit B3 (INTEGRA F)  125-1 MG CAPS Take 1 capsule by mouth daily.  30 capsule  1  . potassium chloride SA (K-DUR,KLOR-CON) 20 MEQ tablet Take 1 tablet (20 mEq total) by mouth daily. Take 1 tablet  3 times a day for 1 week, 1 tablet 2 times a day for 1 week and finish out at 1 a day.  50 tablet  0  . Prenatal Vit-Fe Fumarate-FA (PRENATAL MULTIVITAMIN) TABS Take 1 tablet by mouth daily at 12 noon.        ROS: Pertinent findings in history of present illness.  Physical Exam  Blood pressure 128/76, pulse 74, temperature 97.1 F (36.2 C), resp. rate 18, height 5\' 5"  (1.651 m), weight 65.59 kg (144 lb 9.6 oz), last menstrual period 12/05/2012, SpO2 100.00%. Patient Vitals for the past 24 hrs:  BP Temp Pulse Resp SpO2 Height Weight  05/13/13 0128 128/76 mmHg - 74 - - - -  05/12/13 2348 136/99 mmHg - 104 - - - -  05/12/13 2342 135/92 mmHg 97.1 F (36.2 C) 89 18 100 % 5\' 5"  (1.651 m) 65.59 kg (144 lb 9.6 oz)  GENERAL: Well-developed, well-nourished female in mild distress.  HEENT: normocephalic HEART: normal rate RESP: normal effort ABDOMEN: Soft, non-tender, gravid appropriate for gestational age EXTREMITIES: Nontender, no edema NEURO: alert and oriented. DTRs 2+, no clonus SPECULUM EXAM: deferred  Dilation: closed Effacement (%): Thick Cervical Position: Posterior Station: -3, deviated to maternal left Presentation: Vertex Exam by:: Ivonne Andrew CNM  FHT:  Baseline 130 , moderate variability, accelerations present, no decelerations Contractions: q 5 mins   Labs: Results for orders placed during the hospital encounter of 05/12/13 (from the past 24 hour(s))  URINALYSIS, ROUTINE W REFLEX MICROSCOPIC     Status: Abnormal   Collection Time    05/12/13 11:35 PM      Result Value Range   Color, Urine YELLOW  YELLOW   APPearance CLEAR  CLEAR   Specific Gravity, Urine <1.005 (*) 1.005 - 1.030   pH 7.0  5.0 - 8.0   Glucose, UA NEGATIVE  NEGATIVE mg/dL   Hgb urine dipstick TRACE (*) NEGATIVE   Bilirubin Urine  NEGATIVE  NEGATIVE   Ketones, ur NEGATIVE  NEGATIVE mg/dL   Protein, ur NEGATIVE  NEGATIVE mg/dL   Urobilinogen, UA 0.2  0.0 - 1.0 mg/dL   Nitrite NEGATIVE  NEGATIVE   Leukocytes, UA LARGE (*) NEGATIVE  PROTEIN / CREATININE RATIO, URINE     Status: Abnormal   Collection Time    05/12/13 11:35 PM      Result Value Range   Creatinine, Urine 14.73     Total Protein, Urine 2.8     PROTEIN CREATININE RATIO 0.19 (*) 0.00 - 0.15  URINE MICROSCOPIC-ADD ON     Status: None   Collection Time    05/12/13 11:35 PM      Result Value Range   Squamous Epithelial / LPF RARE  RARE   WBC, UA 11-20  <3 WBC/hpf   RBC / HPF 0-2  <3 RBC/hpf   Bacteria, UA RARE  RARE  CBC     Status: Abnormal   Collection Time    05/13/13 12:50 AM      Result Value Range   WBC 7.5  4.0 - 10.5 K/uL   RBC 3.71 (*) 3.87 - 5.11 MIL/uL   Hemoglobin 11.1 (*) 12.0 - 15.0 g/dL   HCT 16.1 (*) 09.6 - 04.5 %   MCV 87.1  78.0 - 100.0 fL   MCH 29.9  26.0 - 34.0 pg   MCHC 34.4  30.0 - 36.0 g/dL   RDW 40.9 (*) 81.1 - 91.4 %   Platelets 164  150 - 400 K/uL  COMPREHENSIVE METABOLIC PANEL     Status: Abnormal   Collection Time    05/13/13 12:50 AM      Result Value Range   Sodium 136  135 - 145 mEq/L   Potassium 3.1 (*) 3.5 - 5.1 mEq/L   Chloride 102  96 - 112 mEq/L   CO2 21  19 - 32 mEq/L   Glucose, Bld 83  70 - 99 mg/dL   BUN 3 (*) 6 - 23 mg/dL   Creatinine, Ser 7.82 (*) 0.50 - 1.10 mg/dL   Calcium 8.9  8.4 - 95.6 mg/dL   Total Protein 6.5  6.0 - 8.3 g/dL   Albumin 3.0 (*) 3.5 - 5.2 g/dL   AST 17  0 - 37 U/L   ALT 9  0 - 35 U/L   Alkaline Phosphatase 156 (*) 39 - 117 U/L   Total Bilirubin 0.3  0.3 - 1.2  mg/dL   GFR calc non Af Amer >90  >90 mL/min   GFR calc Af Amer >90  >90 mL/min    Imaging:  No results found. MAU Course: 0006: CBC, CMET, IV bolus ordered.   22: Dr. Jackelyn Knife notified of cervical change. No improvement in UC's w/ fluid bolus. Elevated BPs, normal labs. Nubain order.  Dilation:  1 Effacement (%): Thick Cervical Position: Posterior Station: -3 Presentation: Vertex Exam by:: Ivonne Andrew CNM  0320:  Dr. Jackelyn Knife notified of cervical change, UC's same or worse. Admit for labor. Dilation: 3 Effacement (%): 50 Cervical Position: Posterior Station: -3 Presentation: Vertex Pos bloody show Exam by:: Ivonne Andrew CNM  Assessment: 1. Labor: early, preterm 2. Fetal Wellbeing: Category I  3. Pain Control: may have epidural 4. GBS: Unk 5. 34.6 week IUP 6. Dating by 27 week [place].  7. Some elevated BPs w/ normal labs, asymptomatic.   Plan:  1. Admit to BS per consult with MD 2. Routine L&D orders 3. Analgesia/anesthesia PRN  4. GBS PCR collected 5. PCN for unk GBS, preterm  [place] 05/13/2013 3:49 AM

## 2013-05-14 LAB — CBC
HCT: 29.8 % — ABNORMAL LOW (ref 36.0–46.0)
MCH: 29.8 pg (ref 26.0–34.0)
MCHC: 34.2 g/dL (ref 30.0–36.0)
RDW: 16.1 % — ABNORMAL HIGH (ref 11.5–15.5)

## 2013-05-14 NOTE — Progress Notes (Signed)
Post Partum Day 1 Subjective: no complaints, up ad lib and tolerating PO  Objective: Blood pressure 125/74, pulse 50, temperature 97.6 F (36.4 C), temperature source Oral, resp. rate 18, height 5\' 5"  (1.651 m), weight 65.59 kg (144 lb 9.6 oz), last menstrual period 12/05/2012, SpO2 98.00%, unknown if currently breastfeeding.  Physical Exam:  General: alert and cooperative Lochia: appropriate Uterine Fundus: firm    Recent Labs  05/13/13 0945 05/14/13 0620  HGB 10.7* 10.2*  HCT 31.4* 29.8*    Assessment/Plan: Plan for discharge tomorrow Pt to get Depo prior to d/c Plan Essure in office pending approval from [employer] Pt desires to proceed with circumcision of baby   LOS: 2 days   Racer Quam W 05/14/2013, 8:26 AM

## 2013-05-14 NOTE — Clinical Social Work Maternal (Signed)
     Clinical Social Work Department PSYCHOSOCIAL ASSESSMENT - MATERNAL/CHILD 05/14/2013  Patient:  Selena Kaiser, Selena Kaiser  Account Number:  [id number]  Admit Date:  05/12/2013  Marjo Bicker Name:    Clinical Social Worker:  Nobie Putnam, LCSW   Date/Time:  05/14/2013 01:42 PM  Date Referred:  05/14/2013   Referral source  CN     Referred reason  [facility]   Other referral source:    I:  FAMILY / HOME ENVIRONMENT Marjo Bicker legal guardian:  PARENT  Guardian - Name Guardian - Age Guardian - Address  Verina Galeno [address] [place], [place]  Not Disclosed     Other household support members/support persons Name Relationship DOB   DAUGHTER 46 years old   SON 27 years old   DAUGHTER 56 years old   SON 87 year old   Other support:   Pt's mother  Floyce Stakes, godmother    II  PSYCHOSOCIAL DATA Information Source:  Patient Interview  Event organiser Employment:   Surveyor, quantity resources:  Media planner If [employer]:   Other  Sales executive  WIC   School / Grade:   Maternity Care Coordinator / Child Services Coordination / Early Interventions:  Cultural issues impacting care:    III  STRENGTHS Strengths  Adequate Resources  Home prepared for Child (including basic supplies)  Supportive family/friends   Strength comment:    IV  RISK FACTORS AND CURRENT PROBLEMS Current Problem:  YES   Risk Factor & Current Problem Patient Issue Family Issue Risk Factor / Current Problem Comment  Other - See comment [facility]    V  SOCIAL WORK ASSESSMENT CSW met with pt to assess reason for [facility] @ 28 weeks.  Pt did not seek PNC sooner because she didn't know she was pregnant.  When pt felt movement in her stomach, she was prompted to come to the hospital.  Once pregnancy was confirmed, she established [facility] & attended appointment regularly.  CSW explained hospital drug testing policy & pt verbalized understanding.  Pt denies any illegal substance use.  UDS is negative,  meconium results are pending.  Pt has supplies for the infant.  Her Godmother, Floyce Stakes was at the bedside & supportive.  CSW will continue to monitor meconium results & make a referral if needed.      VI SOCIAL WORK PLAN Social Work Plan  No Further Intervention Required / No Barriers to Discharge   Type of pt/family education:   If child protective services report - county:   If child protective services report - date:   Information/referral to community resources comment:   Other social work plan:

## 2013-05-15 MED ORDER — IBUPROFEN 600 MG PO TABS: 600.0000 mg | ORAL_TABLET | Freq: Four times a day (QID) | ORAL | Status: DC

## 2013-05-15 MED ORDER — OXYCODONE-ACETAMINOPHEN 5-325 MG PO TABS: 1.0000 | ORAL_TABLET | ORAL | Status: DC | PRN

## 2013-05-15 NOTE — Discharge Summary (Signed)
Obstetric Discharge Summary Reason for Admission: onset of labor Prenatal Procedures: none Intrapartum Procedures: spontaneous vaginal delivery Postpartum Procedures: none Complications-Operative and Postpartum: none Hemoglobin  Date Value Range Status  05/14/2013 10.2* 12.0 - 15.0 g/dL Final     HCT  Date Value Range Status  05/14/2013 29.8* 36.0 - 46.0 % Final    Physical Exam:  General: alert and cooperative Lochia: appropriate Uterine Fundus: firm   Discharge Diagnoses: Preterm pregnancy delivered  Discharge Information: Date: 05/15/2013 Activity: pelvic rest Diet: routine Medications: Ibuprofen and Percocet Condition: stable Instructions: refer to practice specific booklet Discharge to: home Follow-up Information   Follow up with Oliver Pila, MD. Schedule an appointment as soon as possible for a visit in 6 weeks. (we will call with appts)    Contact information:   [address], [address] [place] [address] [phone]       Newborn Data: Live born female  Birth Weight: 5 lb 14.4 oz (2675 g) APGAR: 9, 9  Home with mother.  Oliver Pila 05/15/2013, 8:43 AM

## 2013-05-15 NOTE — Progress Notes (Signed)
Post Partum Day 1 Subjective: no complaints, up ad lib and tolerating PO  Objective: Blood pressure 135/87, pulse 73, temperature 98.3 F (36.8 C), temperature source Oral, resp. rate 18, height 5\' 5"  (1.651 m), weight 65.59 kg (144 lb 9.6 oz), last menstrual period 12/05/2012, SpO2 98.00%, unknown if currently breastfeeding.  Physical Exam:  General: alert and cooperative Lochia: appropriate Uterine Fundus: firm    Recent Labs  05/13/13 0945 05/14/13 0620  HGB 10.7* 10.2*  HCT 31.4* 29.8*    Assessment/Plan: Discharge home   LOS: 3 days   Selena Kaiser W 05/15/2013, 8:40 AM

## 2013-11-06 ENCOUNTER — Encounter (HOSPITAL_COMMUNITY): Payer: Self-pay | Admitting: Pharmacy Technician

## 2013-11-10 ENCOUNTER — Encounter (HOSPITAL_COMMUNITY): Payer: Self-pay | Admitting: *Deleted

## 2013-11-18 ENCOUNTER — Encounter (HOSPITAL_COMMUNITY): Admitting: Anesthesiology

## 2013-11-18 ENCOUNTER — Encounter (HOSPITAL_COMMUNITY): Payer: Self-pay | Admitting: *Deleted

## 2013-11-18 ENCOUNTER — Ambulatory Visit (HOSPITAL_COMMUNITY): Admitting: Anesthesiology

## 2013-11-18 ENCOUNTER — Ambulatory Visit (HOSPITAL_COMMUNITY)
Admission: RE | Admit: 2013-11-18 | Discharge: 2013-11-18 | Disposition: A | Discharge status: Home / Self Care | Source: Ambulatory Visit | Attending: Obstetrics and Gynecology | Admitting: Obstetrics and Gynecology

## 2013-11-18 ENCOUNTER — Encounter (HOSPITAL_COMMUNITY)
Admission: RE | Disposition: A | Discharge status: Home / Self Care | Payer: Self-pay | Source: Ambulatory Visit | Attending: Obstetrics and Gynecology

## 2013-11-18 DIAGNOSIS — Z302 Encounter for sterilization: Secondary | ICD-10-CM | POA: Insufficient documentation

## 2013-11-18 HISTORY — PX: LAPAROSCOPIC TUBAL LIGATION: SHX1937

## 2013-11-18 LAB — CBC
MCH: 29.3 pg (ref 26.0–34.0)
MCHC: 33.7 g/dL (ref 30.0–36.0)
MCV: 87.1 fL (ref 78.0–100.0)
Platelets: 274 10*3/uL (ref 150–400)
RBC: 4.5 MIL/uL (ref 3.87–5.11)
RDW: 12.7 % (ref 11.5–15.5)
WBC: 5.1 10*3/uL (ref 4.0–10.5)

## 2013-11-18 LAB — PREGNANCY, URINE: Preg Test, Ur: NEGATIVE

## 2013-11-18 SURGERY — LIGATION, FALLOPIAN TUBE, LAPAROSCOPIC
Anesthesia: General | Laterality: Bilateral

## 2013-11-18 MED ORDER — SODIUM CHLORIDE 0.9 % IJ SOLN
INTRAMUSCULAR | Status: AC
  Filled 2013-11-18: qty 10

## 2013-11-18 MED ORDER — MIDAZOLAM HCL 2 MG/2ML IJ SOLN
INTRAMUSCULAR | Status: AC
  Filled 2013-11-18: qty 2

## 2013-11-18 MED ORDER — IBUPROFEN 200 MG PO TABS: 600.0000 mg | ORAL_TABLET | Freq: Four times a day (QID) | ORAL | Status: DC | PRN

## 2013-11-18 MED ORDER — DIPHENHYDRAMINE HCL 50 MG/ML IJ SOLN
INTRAMUSCULAR | Status: AC
  Filled 2013-11-18: qty 1

## 2013-11-18 MED ORDER — DEXAMETHASONE SODIUM PHOSPHATE 10 MG/ML IJ SOLN
INTRAMUSCULAR | Status: AC
  Filled 2013-11-18: qty 1

## 2013-11-18 MED ORDER — DIPHENHYDRAMINE HCL 50 MG/ML IJ SOLN
12.5000 mg | Freq: Once | INTRAMUSCULAR | Status: AC
  Administered 2013-11-18: 12.5 mg via INTRAVENOUS

## 2013-11-18 MED ORDER — ROCURONIUM BROMIDE 100 MG/10ML IV SOLN
INTRAVENOUS | Status: AC
  Filled 2013-11-18: qty 1

## 2013-11-18 MED ORDER — ONDANSETRON HCL 4 MG/2ML IJ SOLN
INTRAMUSCULAR | Status: AC
  Filled 2013-11-18: qty 2

## 2013-11-18 MED ORDER — BUPIVACAINE HCL (PF) 0.25 % IJ SOLN
INTRAMUSCULAR | Status: DC | PRN
  Administered 2013-11-18: 9 mL

## 2013-11-18 MED ORDER — FENTANYL CITRATE 0.05 MG/ML IJ SOLN
INTRAMUSCULAR | Status: AC
  Administered 2013-11-18: 50 ug
  Filled 2013-11-18: qty 2

## 2013-11-18 MED ORDER — NEOSTIGMINE METHYLSULFATE 1 MG/ML IJ SOLN
INTRAMUSCULAR | Status: DC | PRN
  Administered 2013-11-18: 2.5 mg via INTRAVENOUS

## 2013-11-18 MED ORDER — ONDANSETRON HCL 4 MG/2ML IJ SOLN
INTRAMUSCULAR | Status: DC | PRN
  Administered 2013-11-18: 4 mg via INTRAVENOUS

## 2013-11-18 MED ORDER — PROPOFOL 10 MG/ML IV EMUL
INTRAVENOUS | Status: AC
  Filled 2013-11-18: qty 20

## 2013-11-18 MED ORDER — DEXAMETHASONE SODIUM PHOSPHATE 10 MG/ML IJ SOLN
INTRAMUSCULAR | Status: DC | PRN
  Administered 2013-11-18: 10 mg via INTRAVENOUS

## 2013-11-18 MED ORDER — MIDAZOLAM HCL 5 MG/5ML IJ SOLN
INTRAMUSCULAR | Status: DC | PRN
  Administered 2013-11-18: 2 mg via INTRAVENOUS

## 2013-11-18 MED ORDER — LIDOCAINE HCL (CARDIAC) 20 MG/ML IV SOLN
INTRAVENOUS | Status: AC
  Filled 2013-11-18: qty 5

## 2013-11-18 MED ORDER — BUPIVACAINE HCL (PF) 0.25 % IJ SOLN
INTRAMUSCULAR | Status: AC
  Filled 2013-11-18: qty 30

## 2013-11-18 MED ORDER — LACTATED RINGERS IV SOLN
INTRAVENOUS | Status: DC
Start: 2013-11-18 — End: 2013-11-18

## 2013-11-18 MED ORDER — FENTANYL CITRATE 0.05 MG/ML IJ SOLN
INTRAMUSCULAR | Status: AC
  Filled 2013-11-18: qty 5

## 2013-11-18 MED ORDER — GLYCOPYRROLATE 0.2 MG/ML IJ SOLN
INTRAMUSCULAR | Status: DC | PRN
  Administered 2013-11-18: .5 mg via INTRAVENOUS

## 2013-11-18 MED ORDER — KETOROLAC TROMETHAMINE 30 MG/ML IJ SOLN
INTRAMUSCULAR | Status: DC | PRN
  Administered 2013-11-18: 30 mg via INTRAVENOUS

## 2013-11-18 MED ORDER — ROCURONIUM BROMIDE 100 MG/10ML IV SOLN
INTRAVENOUS | Status: DC | PRN
  Administered 2013-11-18: 35 mg via INTRAVENOUS

## 2013-11-18 MED ORDER — PROPOFOL 10 MG/ML IV BOLUS
INTRAVENOUS | Status: DC | PRN
  Administered 2013-11-18: 150 mg via INTRAVENOUS

## 2013-11-18 MED ORDER — LIDOCAINE HCL (CARDIAC) 20 MG/ML IV SOLN
INTRAVENOUS | Status: DC | PRN
  Administered 2013-11-18 (×2): 50 mg via INTRAVENOUS

## 2013-11-18 MED ORDER — FENTANYL CITRATE 0.05 MG/ML IJ SOLN
INTRAMUSCULAR | Status: DC | PRN
  Administered 2013-11-18 (×2): 100 ug via INTRAVENOUS
  Administered 2013-11-18: 50 ug via INTRAVENOUS

## 2013-11-18 MED ORDER — KETOROLAC TROMETHAMINE 30 MG/ML IJ SOLN
INTRAMUSCULAR | Status: AC
  Filled 2013-11-18: qty 1

## 2013-11-18 MED ORDER — LACTATED RINGERS IV SOLN
INTRAVENOUS | Status: DC
  Administered 2013-11-18 (×3): via INTRAVENOUS

## 2013-11-18 MED ORDER — OXYCODONE-ACETAMINOPHEN 5-325 MG PO TABS: 1.0000 | ORAL_TABLET | ORAL | Status: DC | PRN

## 2013-11-18 SURGICAL SUPPLY — 18 items
ADH SKN CLS APL DERMABOND .7 (GAUZE/BANDAGES/DRESSINGS) ×1
CATH ROBINSON RED A/P 16FR (CATHETERS) ×2 IMPLANT
CHLORAPREP W/TINT 26ML (MISCELLANEOUS) ×2 IMPLANT
CLOTH BEACON ORANGE TIMEOUT ST (SAFETY) ×2 IMPLANT
DERMABOND ADVANCED (GAUZE/BANDAGES/DRESSINGS) ×1
DERMABOND ADVANCED .7 DNX12 (GAUZE/BANDAGES/DRESSINGS) ×1 IMPLANT
GLOVE BIO SURGEON STRL SZ 6.5 (GLOVE) ×2 IMPLANT
GOWN PREVENTION PLUS LG XLONG (DISPOSABLE) ×4 IMPLANT
NEEDLE INSUFFLATION 120MM (ENDOMECHANICALS) ×2 IMPLANT
PACK LAPAROSCOPY BASIN (CUSTOM PROCEDURE TRAY) ×2 IMPLANT
SUT VIC AB 3-0 PS2 18 (SUTURE)
SUT VIC AB 3-0 PS2 18XBRD (SUTURE) IMPLANT
SUT VICRYL 0 UR6 27IN ABS (SUTURE) ×2 IMPLANT
TOWEL OR 17X24 6PK STRL BLUE (TOWEL DISPOSABLE) ×4 IMPLANT
TROCAR XCEL NON-BLD 11X100MML (ENDOMECHANICALS) ×2 IMPLANT
TROCAR XCEL NON-BLD 5MMX100MML (ENDOMECHANICALS) IMPLANT
WARMER LAPAROSCOPE (MISCELLANEOUS) ×2 IMPLANT
WATER STERILE IRR 1000ML POUR (IV SOLUTION) ×2 IMPLANT

## 2013-11-18 NOTE — H&P (Signed)
Selena Kaiser is an 27 y.o. female. G5P5 who presents for a sterilization procedure with a desire for permanent sterility.  THe patient has had Essure attempted x 2 but the coil would not thread the tube on the right despite multiple attempts.  She is on Depo provera for birth control.  She has no other health issues.  Pertinent Gynecological History: OB History:NSVD x 5   Menstrual History:  No LMP recorded.--on Depo PRovera    Past Medical History  Diagnosis Date  . Infection     UTI    Past Surgical History  Procedure Laterality Date  . No past surgeries      Family History  Problem Relation Age of Onset  . Hypertension Mother   . Cancer Maternal Aunt     lung  . Hypertension Maternal Grandmother   . Cancer Maternal Grandmother     lung  . Cancer Paternal Grandmother     Social History:  reports that she has been smoking Cigarettes.  She has a 1 pack-year smoking history. She uses smokeless tobacco. She reports that she does not drink alcohol or use illicit drugs.  Allergies: No Known Allergies  No prescriptions prior to admission    ROS  unknown if currently breastfeeding. Physical Exam  Constitutional: She is oriented to person, place, and time. She appears well-developed and well-nourished.  Cardiovascular: Normal rate and regular rhythm.   Respiratory: Effort normal and breath sounds normal.  GI: Soft.  Genitourinary: Vagina normal and uterus normal.  Neurological: She is alert and oriented to person, place, and time.  Psychiatric: She has a normal mood and affect. Her behavior is normal.    No results found for this or any previous visit (from the past 24 hour(s)).  No results found.  Assessment/Plan: The risks of laparoscopic tubal sterilization were d/w the patient including bleeding, infection and possible damage to bowel and bladder.  We discussed options of fulgaration vs distal salpingectomy for possible future benefit in reducing risk of  ovarian cancer and she would like a distal salpingectomy.  We discussed failure rate of 1/100, though less with salpingectomy, and possible ectopic pregnancy.  She desires to proceed.  Oliver Pila 11/18/2013, 6:35 AM

## 2013-11-18 NOTE — Anesthesia Postprocedure Evaluation (Signed)
  Anesthesia Post Note  Patient: Selena Kaiser  Procedure(s) Performed: Procedure(s) (LRB): LAPAROSCOPIC BILATERAL TUBAL LIGATION (Bilateral)  Anesthesia type: GA  Patient location: PACU  Post pain: Pain level controlled  Post assessment: Post-op Vital signs reviewed  Last Vitals:  Filed Vitals:   11/18/13 1345  BP: 121/77  Pulse: 63  Temp:   Resp: 17    Post vital signs: Reviewed  Level of consciousness: sedated  Complications: No apparent anesthesia complications

## 2013-11-18 NOTE — Transfer of Care (Signed)
Immediate Anesthesia Transfer of Care Note  Patient: Selena Kaiser  Procedure(s) Performed: Procedure(s) with comments: LAPAROSCOPIC BILATERAL TUBAL LIGATION (Bilateral) - fallopian tubes  Patient Location: PACU  Anesthesia Type:General  Level of Consciousness: awake, alert  and oriented  Airway & Oxygen Therapy: Patient Spontanous Breathing and Patient connected to nasal cannula oxygen  Post-op Assessment: Report given to PACU RN and Post -op Vital signs reviewed and stable  Post vital signs: Reviewed and stable  Complications: No apparent anesthesia complications

## 2013-11-18 NOTE — Progress Notes (Signed)
Patient ID: Selena Kaiser, female   DOB: June 02, 1986, 27 y.o.   MRN: [id number] Per pt no changes in dictated H&P and ready to proceed.  She desires a bilateral partial salpingectomy.  Brief exam WNL.

## 2013-11-18 NOTE — Op Note (Signed)
Operative report   Preoperative diagnosis Desires permanent sterility  Postoperative diagnosis Same  Procedure Laparoscopic tubal sterilization with bilateral  distal salpingectomy  Surgeon Dr. Huel Cote  Anesthesia Gen.  Fluids Estimated blood loss minimal Urine output approximately 10 cc straight catheter prior procedure IV fluids 800 cc LR  Findings the uterus ovaries and tubes appeared normal the appendix and liver edge appeared normal  Specimen Right and left fallopian tube fragments were sent to pathology  Procedure note Patient was taken to the operating room where general anesthesia was obtained without difficulty. She was prepped and draped in normal sterile fashion in the dorsal lithotomy position. An appropriate time out was performed. A speculum was placed within the vagina and a Hulka tenaculum introduced into the uterine cavity. Attention was then turned to the abdomen where small infraumbilical incision was made with the scalpel. The varies needle was then introduced into the incision and pneumoperitoneum obtained with approximately 2 and half liters CO2 gas. An 11 mm port was then introduced into the umbilicus with the Opti-Vu trocar.Two  additional 5 mm ports were placed in the upper right quadrant and left quadrant. Each port site was injected with quarter percent Marcaine before placement. With the ports in place the tubes and ovaries were inspected and tube grasped with atraumatic grasper proximally one third of the way down to the harmonic scalpel was utilized to resect the distal end of the tube and this was retrieved through the umbilical port and handed off to pathology. At the conclusion of the procedure the pedicles appeared hemostatic the liver edge and appendix were inspected and appeared normal. All instruments and sponges were removed from the abdomen and the trochars were removed under direct visualization. These were then sutured with 0 Vicryl in  deep suture at the umbilicus and 3-0 Vicryl in a subcuticular stitch at each port sites Dermabond was used on the lateral port sites and the cover on the umbilical port site. Patient was then taken to the recovery room in good condition.

## 2013-11-18 NOTE — Preoperative (Signed)
Beta Blockers   Reason not to administer Beta Blockers:Not Applicable 

## 2013-11-18 NOTE — Brief Op Note (Signed)
11/18/2013  1:16 PM  PATIENT:  [name]  27 y.o. female  PRE-OPERATIVE DIAGNOSIS:  Sterilization, bilateral distal salpingectomy  POST-OPERATIVE DIAGNOSIS:  same  PROCEDURE:  Procedure(s) with comments: LAPAROSCOPIC BILATERAL TUBAL LIGATION (Bilateral) - fallopian tubes with distal salpingectomy  SURGEON:  Surgeon(s) and Role:    * Oliver Pila, MD - Primary  ANESTHESIA:   local and general  EBL:  Total I/O In: -  Out: 10 [Blood:10]  BLOOD ADMINISTERED:none  DRAINS: none   LOCAL MEDICATIONS USED:  MARCAINE     SPECIMEN: distal fallopian tube segments DISPOSITION OF SPECIMEN:  PATHOLOGY  COUNTS:  YES  TOURNIQUET:  * No tourniquets in log *  DICTATION: .Dragon Dictation  PLAN OF CARE: Discharge to home after PACU  PATIENT DISPOSITION:  PACU - hemodynamically stable.   Delay start of Pharmacological VTE agent (>24hrs) due to surgical blood loss or risk of bleeding: not applicable

## 2013-11-18 NOTE — Anesthesia Preprocedure Evaluation (Signed)
Anesthesia Evaluation  Patient identified by MRN, date of birth, ID band Patient awake    Reviewed: Allergy & Precautions, H&P , Patient's Chart, lab work & pertinent test results, reviewed documented beta blocker date and time   History of Anesthesia Complications Negative for: history of anesthetic complications  Airway Mallampati: II  TM Distance: >3 FB Neck ROM: full    Dental   Pulmonary Current Smoker,  breath sounds clear to auscultation        Cardiovascular Exercise Tolerance: Good Rhythm:regular Rate:Normal     Neuro/Psych    GI/Hepatic   Endo/Other    Renal/GU      Musculoskeletal   Abdominal   Peds  Hematology   Anesthesia Other Findings   Reproductive/Obstetrics                             Anesthesia Physical Anesthesia Plan  ASA: II  Anesthesia Plan: General ETT   Post-op Pain Management:    Induction:   Airway Management Planned:   Additional Equipment:   Intra-op Plan:   Post-operative Plan:   Informed Consent: I have reviewed the patients History and Physical, chart, labs and discussed the procedure including the risks, benefits and alternatives for the proposed anesthesia with the patient or authorized representative who has indicated his/her understanding and acceptance.   Dental Advisory Given  Plan Discussed with: CRNA and Surgeon  Anesthesia Plan Comments:         Anesthesia Quick Evaluation  

## 2013-11-19 ENCOUNTER — Encounter (HOSPITAL_COMMUNITY): Payer: Self-pay | Admitting: Obstetrics and Gynecology

## 2014-10-18 ENCOUNTER — Encounter (HOSPITAL_COMMUNITY): Payer: Self-pay | Admitting: Obstetrics and Gynecology

## 2014-11-16 ENCOUNTER — Emergency Department (HOSPITAL_COMMUNITY)
Admission: EM | Admit: 2014-11-16 | Discharge: 2014-11-16 | Disposition: A | Discharge status: Home / Self Care | Attending: Emergency Medicine | Admitting: Emergency Medicine

## 2014-11-16 DIAGNOSIS — N939 Abnormal uterine and vaginal bleeding, unspecified: Secondary | ICD-10-CM | POA: Insufficient documentation

## 2014-11-16 DIAGNOSIS — Z720 Tobacco use: Secondary | ICD-10-CM | POA: Insufficient documentation

## 2014-11-16 DIAGNOSIS — K5900 Constipation, unspecified: Secondary | ICD-10-CM | POA: Diagnosis not present

## 2014-11-16 DIAGNOSIS — R1030 Lower abdominal pain, unspecified: Secondary | ICD-10-CM | POA: Diagnosis present

## 2014-11-16 DIAGNOSIS — Z8659 Personal history of other mental and behavioral disorders: Secondary | ICD-10-CM | POA: Insufficient documentation

## 2014-11-16 DIAGNOSIS — Z87440 Personal history of urinary (tract) infections: Secondary | ICD-10-CM | POA: Diagnosis not present

## 2014-11-16 DIAGNOSIS — Z3202 Encounter for pregnancy test, result negative: Secondary | ICD-10-CM | POA: Insufficient documentation

## 2014-11-16 LAB — CBC WITH DIFFERENTIAL/PLATELET
BASOS PCT: 0 % (ref 0–1)
Basophils Absolute: 0 10*3/uL (ref 0.0–0.1)
Eosinophils Absolute: 0.1 10*3/uL (ref 0.0–0.7)
Eosinophils Relative: 3 % (ref 0–5)
HCT: 35.1 % — ABNORMAL LOW (ref 36.0–46.0)
HEMOGLOBIN: 11.6 g/dL — AB (ref 12.0–15.0)
LYMPHS PCT: 34 % (ref 12–46)
Lymphs Abs: 1.2 10*3/uL (ref 0.7–4.0)
MCH: 29.3 pg (ref 26.0–34.0)
MCHC: 33 g/dL (ref 30.0–36.0)
MCV: 88.6 fL (ref 78.0–100.0)
MONOS PCT: 8 % (ref 3–12)
Monocytes Absolute: 0.3 10*3/uL (ref 0.1–1.0)
NEUTROS ABS: 1.9 10*3/uL (ref 1.7–7.7)
NEUTROS PCT: 55 % (ref 43–77)
Platelets: 210 10*3/uL (ref 150–400)
RBC: 3.96 MIL/uL (ref 3.87–5.11)
RDW: 15.6 % — ABNORMAL HIGH (ref 11.5–15.5)
WBC: 3.6 10*3/uL — AB (ref 4.0–10.5)

## 2014-11-16 LAB — COMPREHENSIVE METABOLIC PANEL
ALBUMIN: 3.5 g/dL (ref 3.5–5.2)
ALT: 11 U/L (ref 0–35)
ANION GAP: 10 (ref 5–15)
AST: 19 U/L (ref 0–37)
Alkaline Phosphatase: 83 U/L (ref 39–117)
BILIRUBIN TOTAL: 0.3 mg/dL (ref 0.3–1.2)
BUN: 6 mg/dL (ref 6–23)
CHLORIDE: 105 meq/L (ref 96–112)
CO2: 25 mEq/L (ref 19–32)
Calcium: 8.8 mg/dL (ref 8.4–10.5)
Creatinine, Ser: 0.64 mg/dL (ref 0.50–1.10)
GFR calc Af Amer: >90 mL/min (ref >90)
GFR calc non Af Amer: >90 mL/min (ref >90)
Glucose, Bld: 81 mg/dL (ref 70–99)
POTASSIUM: 4.4 meq/L (ref 3.7–5.3)
Sodium: 140 mEq/L (ref 137–147)
Total Protein: 6.9 g/dL (ref 6.0–8.3)

## 2014-11-16 LAB — URINALYSIS, ROUTINE W REFLEX MICROSCOPIC
BILIRUBIN URINE: NEGATIVE
Glucose, UA: NEGATIVE mg/dL
Ketones, ur: 15 mg/dL — AB
Nitrite: NEGATIVE
PH: 6.5 (ref 5.0–8.0)
Protein, ur: NEGATIVE mg/dL
Specific Gravity, Urine: 1.024 (ref 1.005–1.030)
Urobilinogen, UA: 1 mg/dL (ref 0.0–1.0)

## 2014-11-16 LAB — POC URINE PREG, ED: Preg Test, Ur: NEGATIVE

## 2014-11-16 LAB — URINE MICROSCOPIC-ADD ON

## 2014-11-16 MED ORDER — SODIUM CHLORIDE 0.9 % IV BOLUS (SEPSIS)
1000.0000 mL | Freq: Once | INTRAVENOUS | Status: AC
  Administered 2014-11-16: 1000 mL via INTRAVENOUS

## 2014-11-16 NOTE — ED Notes (Signed)
Abd. Pain since 11/10/2014. Generalized abd. Pain and lower back pain. Last bm 11/15/14. No problems with urination. Having vaginal bleeding.

## 2014-11-16 NOTE — Discharge Instructions (Signed)
Take MiraLAX, 3 capfuls, tonight, then 3 capfuls tomorrow morning. If you continue to have constipation, continue taking too capfuls of MiraLAX twice a day for the next 3 days. Return to the emergency department if he develops a fever greater than 100.4, right lower quadrant pain, become nauseous or vomiting.  Constipation Constipation is when a person:  Poops (has a bowel movement) less than 3 times a week.  Has a hard time pooping.  Has poop that is dry, hard, or bigger than normal. HOME CARE   Eat foods with a lot of fiber in them. This includes fruits, vegetables, beans, and whole grains such as brown rice.  Avoid fatty foods and foods with a lot of sugar. This includes french fries, hamburgers, cookies, candy, and soda.  If you are not getting enough fiber from food, take products with added fiber in them (supplements).  Drink enough fluid to keep your pee (urine) clear or pale yellow.  Exercise on a regular basis, or as told by your doctor.  Go to the restroom when you feel like you need to poop. Do not hold it.  Only take medicine as told by your doctor. Do not take medicines that help you poop (laxatives) without talking to your doctor first. GET HELP RIGHT AWAY IF:   You have bright red blood in your poop (stool).  Your constipation lasts more than 4 days or gets worse.  You have belly (abdominal) or butt (rectal) pain.  You have thin poop (as thin as a pencil).  You lose weight, and it cannot be explained. MAKE SURE YOU:   Understand these instructions.  Will watch your condition.  Will get help right away if you are not doing well or get worse. Document Released: 05/21/2008 Document Revised: 12/08/2013 Document Reviewed: 09/14/2013 [facility] Patient Information 2015 [employer] [place]. This information is not intended to replace advice given to you by your health care provider. Make sure you discuss any questions you have with your health care provider.

## 2014-11-16 NOTE — ED Provider Notes (Signed)
Patient seen/examined in the Emergency Department in conjunction with Resident Physician Provider [facility] Patient reports abd pain and constipation Exam : awake/alert, no focal abdominal tenderness Plan: appropriate for d/c home   Joya Gaskinsonald W Helane Briceno, MD 11/16/14 1734

## 2014-11-16 NOTE — ED Provider Notes (Signed)
CSN: [id number]     Arrival date & time 11/16/14  1341 History   First MD Initiated Contact with Patient 11/16/14 1616     Chief Complaint  Patient presents with  . Abdominal Pain     (Consider location/radiation/quality/duration/timing/severity/associated sxs/prior Treatment) HPI The patient is a 28 year old female with a history of bipolar disorder, who presents complaining of vomiting all pain. She says that she has had lower abdominal pain that she says feels like pressure, associated with constipation and painful bowel movements, worsening over the past week. She says this all began after starting to take valproic acid for her bipolar disorder. She says her last bowel movement was yesterday, and it was hard and painful to pass, and only consisted of a small amount of stool. There was no bleeding associated with it.  She reports that she is currently having her normal period.   She denies any dysuria or pelvic discharge.  Past Medical History  Diagnosis Date  . Infection     UTI   Past Surgical History  Procedure Laterality Date  . No past surgeries    . Laparoscopic tubal ligation Bilateral 11/18/2013    Procedure: LAPAROSCOPIC BILATERAL TUBAL LIGATION;  Surgeon: Oliver PilaKathy W Richardson, MD;  Location: [facility];  Service: Gynecology;  Laterality: Bilateral;  fallopian tubes   Family History  Problem Relation Age of Onset  . Hypertension Mother   . Cancer Maternal Aunt     lung  . Hypertension Maternal Grandmother   . Cancer Maternal Grandmother     lung  . Cancer Paternal Grandmother    History  Substance Use Topics  . Smoking status: Current Every Day Smoker -- 0.25 packs/day for 4 years    Types: Cigarettes  . Smokeless tobacco: Current User  . Alcohol Use: No   OB History    Gravida Para Term Preterm AB TAB SAB Ectopic Multiple Living   5 5 2 3  0 0 0 0 0 5     Review of Systems  Gastrointestinal: Positive for abdominal pain and constipation. Negative for nausea,  vomiting, blood in stool and anal bleeding.  Genitourinary: Positive for vaginal bleeding. Negative for dysuria, vaginal discharge and vaginal pain.  All other systems reviewed and are negative.     Allergies  Review of patient's allergies indicates no known allergies.  Home Medications   Prior to Admission medications   Medication Sig Start Date End Date Taking? Authorizing Provider  divalproex (DEPAKOTE ER) 500 MG 24 hr tablet Take 500 mg by mouth at bedtime.   Yes Historical Provider, MD  ibuprofen (ADVIL) 200 MG tablet Take 3 tablets (600 mg total) by mouth every 6 (six) hours as needed for moderate pain. 11/18/13  Yes Oliver PilaKathy W Richardson, MD  Fe Fum-FePoly-FA-Vit C-Vit B3 (INTEGRA F) 125-1 MG CAPS Take 1 capsule by mouth daily. Patient not taking: Reported on 11/16/2014 03/27/13   Marlis EdelsonWalidah N Karim, CNM  oxyCODONE-acetaminophen (ROXICET) 5-325 MG per tablet Take 1 tablet by mouth every 4 (four) hours as needed for severe pain. Patient not taking: Reported on 11/16/2014 11/18/13   Oliver PilaKathy W Richardson, MD  potassium chloride SA (K-DUR,KLOR-CON) 20 MEQ tablet Take 1 tablet (20 mEq total) by mouth daily. Take 1 tablet  3 times a day for 1 week, 1 tablet 2 times a day for 1 week and finish out at 1 a day. Patient not taking: Reported on 11/16/2014 03/26/13   Lazaro ArmsLuther H Eure, MD   BP 143/86 mmHg  Pulse 63  Temp(Src) 97.8 F (36.6 C) (Oral)  Resp 18  Ht 5\' 5"  (1.651 m)  Wt 123 lb (55.792 kg)  BMI 20.47 kg/m2  SpO2 100%  LMP 10/31/2014 (Exact Date)  Breastfeeding? No Physical Exam  Constitutional: She is oriented to person, place, and time. She appears well-developed and well-nourished.  HENT:  Head: Normocephalic and atraumatic.  Eyes: Pupils are equal, round, and reactive to light.  Neck: Normal range of motion. Neck supple.  Cardiovascular: Normal rate, regular rhythm and intact distal pulses.   Pulmonary/Chest: Effort normal and breath sounds normal.  Abdominal: Soft. Bowel sounds are  normal. She exhibits no distension. There is no tenderness. There is no rebound.  Genitourinary: Rectal exam shows no external hemorrhoid, no internal hemorrhoid, no fissure and no tenderness. Guaiac negative stool.  Hard stool in the vault  Musculoskeletal: Normal range of motion. She exhibits no edema.  Neurological: She is alert and oriented to person, place, and time.  Skin: Skin is warm and dry.  Nursing note and vitals reviewed.   ED Course  Procedures (including critical care time) Labs Review Labs Reviewed  CBC WITH DIFFERENTIAL - Abnormal; Notable for the following:    WBC 3.6 (*)    Hemoglobin 11.6 (*)    HCT 35.1 (*)    RDW 15.6 (*)    All other components within normal limits  URINALYSIS, ROUTINE W REFLEX MICROSCOPIC - Abnormal; Notable for the following:    APPearance HAZY (*)    Hgb urine dipstick LARGE (*)    Ketones, ur 15 (*)    Leukocytes, UA SMALL (*)    All other components within normal limits  URINE MICROSCOPIC-ADD ON - Abnormal; Notable for the following:    Squamous Epithelial / LPF FEW (*)    Bacteria, UA FEW (*)    All other components within normal limits  COMPREHENSIVE METABOLIC PANEL  HIV ANTIBODY (ROUTINE TESTING)  RPR  POC URINE PREG, ED    Imaging Review No results found.   EKG Interpretation None      MDM   Final diagnoses:  Constipation, unspecified constipation type    Patient complains of lower abdominal pain she describes as feeling like constipation, as well as difficulty to have a normal bowel movement for the last several days, since starting her Depakote. He has had one bowel movement of hard stool today, which she said was very painful. On abdominal exam and patient has no tenderness to palpation, normal bowel sounds, and is able to pass gas. She has no history of abdominal surgery or risk factors for bowel obstruction. She has no right lower quadrant tenderness or rebound guarding, no fever or leukocytosis to suggest  appendicitis. She is not pregnant, has had no vaginal discharge to suggest PID. On rectal exam patient is hard stool, but no evidence of fissures, and no blood. Likely she is constipated from her Depakote, advised her to use MiraLAX, and we'll have her follow-up as needed, as well as given her strong return precautions for fever, right lower quadrant pain, nausea or vomiting.   Erskine Emeryhris Hadassa Cermak, MD 11/17/14 [id number]  Joya Gaskinsonald W Wickline, MD 11/17/14 [phone]

## 2014-11-17 LAB — HIV ANTIBODY (ROUTINE TESTING W REFLEX): HIV 1&2 Ab, 4th Generation: NONREACTIVE

## 2014-11-17 LAB — RPR

## 2015-10-11 ENCOUNTER — Encounter (HOSPITAL_COMMUNITY): Payer: Self-pay | Admitting: Family Medicine

## 2015-10-11 ENCOUNTER — Emergency Department (HOSPITAL_COMMUNITY)

## 2015-10-11 ENCOUNTER — Emergency Department (HOSPITAL_COMMUNITY)
Admission: EM | Admit: 2015-10-11 | Discharge: 2015-10-11 | Disposition: A | Discharge status: Home / Self Care | Attending: Emergency Medicine | Admitting: Emergency Medicine

## 2015-10-11 DIAGNOSIS — Z79899 Other long term (current) drug therapy: Secondary | ICD-10-CM | POA: Insufficient documentation

## 2015-10-11 DIAGNOSIS — Z720 Tobacco use: Secondary | ICD-10-CM | POA: Insufficient documentation

## 2015-10-11 DIAGNOSIS — S161XXA Strain of muscle, fascia and tendon at neck level, initial encounter: Secondary | ICD-10-CM | POA: Insufficient documentation

## 2015-10-11 DIAGNOSIS — Z87440 Personal history of urinary (tract) infections: Secondary | ICD-10-CM | POA: Insufficient documentation

## 2015-10-11 DIAGNOSIS — Z3202 Encounter for pregnancy test, result negative: Secondary | ICD-10-CM | POA: Insufficient documentation

## 2015-10-11 DIAGNOSIS — Y998 Other external cause status: Secondary | ICD-10-CM | POA: Insufficient documentation

## 2015-10-11 DIAGNOSIS — S39012A Strain of muscle, fascia and tendon of lower back, initial encounter: Secondary | ICD-10-CM | POA: Insufficient documentation

## 2015-10-11 DIAGNOSIS — Y92410 Unspecified street and highway as the place of occurrence of the external cause: Secondary | ICD-10-CM | POA: Insufficient documentation

## 2015-10-11 DIAGNOSIS — Y9389 Activity, other specified: Secondary | ICD-10-CM | POA: Insufficient documentation

## 2015-10-11 LAB — PREGNANCY, URINE: Preg Test, Ur: NEGATIVE

## 2015-10-11 MED ORDER — METHOCARBAMOL 500 MG PO TABS
750.0000 mg | ORAL_TABLET | Freq: Once | ORAL | Status: AC
  Administered 2015-10-11: 750 mg via ORAL
  Filled 2015-10-11: qty 2

## 2015-10-11 MED ORDER — HYDROCODONE-ACETAMINOPHEN 5-325 MG PO TABS
1.0000 | ORAL_TABLET | Freq: Once | ORAL | Status: AC
Start: 2015-10-11 — End: 2015-10-11
  Administered 2015-10-11: 1 via ORAL
  Filled 2015-10-11: qty 1

## 2015-10-11 MED ORDER — IBUPROFEN 200 MG PO TABS
600.0000 mg | ORAL_TABLET | Freq: Once | ORAL | Status: AC
  Administered 2015-10-11: 600 mg via ORAL
  Filled 2015-10-11: qty 3

## 2015-10-11 MED ORDER — METHOCARBAMOL 750 MG PO TABS: 750.0000 mg | ORAL_TABLET | Freq: Three times a day (TID) | ORAL | Status: DC

## 2015-10-11 MED ORDER — IBUPROFEN 600 MG PO TABS: 600.0000 mg | ORAL_TABLET | Freq: Three times a day (TID) | ORAL | Status: DC

## 2015-10-11 MED ORDER — HYDROCODONE-ACETAMINOPHEN 5-325 MG PO TABS
1.0000 | ORAL_TABLET | Freq: Once | ORAL | Status: AC
  Administered 2015-10-11: 1 via ORAL
  Filled 2015-10-11: qty 1

## 2015-10-11 MED ORDER — HYDROCODONE-ACETAMINOPHEN 5-325 MG PO TABS: 1.0000 | ORAL_TABLET | Freq: Four times a day (QID) | ORAL | Status: DC | PRN
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
## 2015-10-11 NOTE — ED Notes (Signed)
Pt cleared from back board by PA, c-collar still on.
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
## 2015-10-11 NOTE — Discharge Instructions (Signed)
Cervical Sprain A cervical sprain is when the tissues (ligaments) that hold the neck bones in place stretch or tear. HOME CARE   Put ice on the injured area.  Put ice in a plastic bag.  Place a towel between your skin and the bag.  Leave the ice on for 15-20 minutes, 3-4 times a day.  You may have been given a collar to wear. This collar keeps your neck from moving while you heal.  Do not take the collar off unless told by your doctor.  If you have long hair, keep it outside of the collar.  Ask your doctor before changing the position of your collar. You may need to change its position over time to make it more comfortable.  If you are allowed to take off the collar for cleaning or bathing, follow your doctor's instructions on how to do it safely.  Keep your collar clean by wiping it with mild soap and water. Dry it completely. If the collar has removable pads, remove them every 1-2 days to hand wash them with soap and water. Allow them to air dry. They should be dry before you wear them in the collar.  Do not drive while wearing the collar.  Only take medicine as told by your doctor.  Keep all doctor visits as told.  Keep all physical therapy visits as told.  Adjust your work station so that you have good posture while you work.  Avoid positions and activities that make your problems worse.  Warm up and stretch before being active. GET HELP IF:  Your pain is not controlled with medicine.  You cannot take less pain medicine over time as planned.  Your activity level does not improve as expected. GET HELP RIGHT AWAY IF:   You are bleeding.  Your stomach is upset.  You have an allergic reaction to your medicine.  You develop new problems that you cannot explain.  You lose feeling (become numb) or you cannot move any part of your body (paralysis).  You have tingling or weakness in any part of your body.  Your symptoms get worse. Symptoms include:  Pain,  soreness, stiffness, puffiness (swelling), or a burning feeling in your neck.  Pain when your neck is touched.  Shoulder or upper back pain.  Limited ability to move your neck.  Headache.  Dizziness.  Your hands or arms feel week, lose feeling, or tingle.  Muscle spasms.  Difficulty swallowing or chewing. MAKE SURE YOU:   Understand these instructions.  Will watch your condition.  Will get help right away if you are not doing well or get worse.   This information is not intended to replace advice given to you by your health care provider. Make sure you discuss any questions you have with your health care provider.   Document Released: 05/21/2008 Document Revised: 08/05/2013 Document Reviewed: 06/10/2013 Elsevier Interactive Patient Education [employer]2016 [employer] Your x-rays were normal.  Thank goodness U been given medication for muscle relaxer, pain control and anti-inflammatory.  Please take these as directed.  Follow-up with your primary care physician if you continue to have pain after 7-10 days

## 2015-12-14 ENCOUNTER — Encounter (HOSPITAL_COMMUNITY): Payer: Self-pay | Admitting: Emergency Medicine

## 2015-12-14 ENCOUNTER — Emergency Department (HOSPITAL_COMMUNITY)
Admission: EM | Admit: 2015-12-14 | Discharge: 2015-12-14 | Disposition: A | Discharge status: Home / Self Care | Attending: Emergency Medicine | Admitting: Emergency Medicine

## 2015-12-14 DIAGNOSIS — Z3202 Encounter for pregnancy test, result negative: Secondary | ICD-10-CM | POA: Insufficient documentation

## 2015-12-14 DIAGNOSIS — F17210 Nicotine dependence, cigarettes, uncomplicated: Secondary | ICD-10-CM | POA: Insufficient documentation

## 2015-12-14 DIAGNOSIS — N739 Female pelvic inflammatory disease, unspecified: Secondary | ICD-10-CM

## 2015-12-14 DIAGNOSIS — Z87440 Personal history of urinary (tract) infections: Secondary | ICD-10-CM | POA: Insufficient documentation

## 2015-12-14 LAB — URINALYSIS, ROUTINE W REFLEX MICROSCOPIC
Bilirubin Urine: NEGATIVE
GLUCOSE, UA: NEGATIVE mg/dL
KETONES UR: NEGATIVE mg/dL
Leukocytes, UA: NEGATIVE
Nitrite: NEGATIVE
PROTEIN: NEGATIVE mg/dL
Specific Gravity, Urine: 1.03 (ref 1.005–1.030)
pH: 6 (ref 5.0–8.0)

## 2015-12-14 LAB — URINE MICROSCOPIC-ADD ON

## 2015-12-14 LAB — COMPREHENSIVE METABOLIC PANEL
ALT: 24 U/L (ref 14–54)
AST: 26 U/L (ref 15–41)
Albumin: 3.9 g/dL (ref 3.5–5.0)
Alkaline Phosphatase: 69 U/L (ref 38–126)
Anion gap: 10 (ref 5–15)
BUN: 7 mg/dL (ref 6–20)
CHLORIDE: 106 mmol/L (ref 101–111)
CO2: 21 mmol/L — AB (ref 22–32)
CREATININE: 0.68 mg/dL (ref 0.44–1.00)
Calcium: 9 mg/dL (ref 8.9–10.3)
GFR calc Af Amer: >60 mL/min (ref >60)
GLUCOSE: 94 mg/dL (ref 65–99)
Potassium: 3.7 mmol/L (ref 3.5–5.1)
Sodium: 137 mmol/L (ref 135–145)
Total Bilirubin: 0.6 mg/dL (ref 0.3–1.2)
Total Protein: 6.9 g/dL (ref 6.5–8.1)

## 2015-12-14 LAB — WET PREP, GENITAL
Sperm: NONE SEEN
Trich, Wet Prep: NONE SEEN
YEAST WET PREP: NONE SEEN

## 2015-12-14 LAB — CBC
HCT: 36.9 % (ref 36.0–46.0)
Hemoglobin: 12.6 g/dL (ref 12.0–15.0)
MCH: 31.3 pg (ref 26.0–34.0)
MCHC: 34.1 g/dL (ref 30.0–36.0)
MCV: 91.8 fL (ref 78.0–100.0)
PLATELETS: 266 10*3/uL (ref 150–400)
RBC: 4.02 MIL/uL (ref 3.87–5.11)
RDW: 12.7 % (ref 11.5–15.5)
WBC: 5.9 10*3/uL (ref 4.0–10.5)

## 2015-12-14 LAB — HIV ANTIBODY (ROUTINE TESTING W REFLEX): HIV SCREEN 4TH GENERATION: NONREACTIVE

## 2015-12-14 LAB — RPR: RPR: NONREACTIVE

## 2015-12-14 LAB — POC URINE PREG, ED: Preg Test, Ur: NEGATIVE

## 2015-12-14 LAB — LIPASE, BLOOD: LIPASE: 21 U/L (ref 11–51)

## 2015-12-14 MED ORDER — STERILE WATER FOR INJECTION IJ SOLN
INTRAMUSCULAR | Status: AC
  Administered 2015-12-14: 2 mL
  Filled 2015-12-14: qty 10

## 2015-12-14 MED ORDER — METRONIDAZOLE 500 MG PO TABS
500.0000 mg | ORAL_TABLET | Freq: Once | ORAL | Status: AC
  Administered 2015-12-14: 500 mg via ORAL
  Filled 2015-12-14: qty 1

## 2015-12-14 MED ORDER — HYDROCODONE-ACETAMINOPHEN 5-325 MG PO TABS: 1.0000 | ORAL_TABLET | Freq: Four times a day (QID) | ORAL | Status: AC | PRN

## 2015-12-14 MED ORDER — DOXYCYCLINE HYCLATE 100 MG PO CAPS: 100.0000 mg | ORAL_CAPSULE | Freq: Two times a day (BID) | ORAL | Status: AC

## 2015-12-14 MED ORDER — IBUPROFEN 600 MG PO TABS: 600.0000 mg | ORAL_TABLET | Freq: Three times a day (TID) | ORAL | Status: AC | PRN

## 2015-12-14 MED ORDER — OXYCODONE-ACETAMINOPHEN 5-325 MG PO TABS
2.0000 | ORAL_TABLET | Freq: Once | ORAL | Status: AC
  Administered 2015-12-14: 2 via ORAL
  Filled 2015-12-14: qty 2

## 2015-12-14 MED ORDER — METRONIDAZOLE 500 MG PO TABS: 500.0000 mg | ORAL_TABLET | Freq: Two times a day (BID) | ORAL | Status: AC

## 2015-12-14 MED ORDER — CEFTRIAXONE SODIUM 250 MG IJ SOLR
250.0000 mg | Freq: Once | INTRAMUSCULAR | Status: AC
  Administered 2015-12-14: 250 mg via INTRAMUSCULAR
  Filled 2015-12-14: qty 250

## 2015-12-14 MED ORDER — DOXYCYCLINE HYCLATE 100 MG PO TABS
100.0000 mg | ORAL_TABLET | Freq: Once | ORAL | Status: AC
  Administered 2015-12-14: 100 mg via ORAL
  Filled 2015-12-14: qty 1

## 2015-12-14 NOTE — ED Provider Notes (Signed)
CSN: [id number]     Arrival date & time 12/14/15  0865 History   First MD Initiated Contact with Patient 12/14/15 0403     Chief Complaint  Patient presents with  . Abdominal Pain      HPI Patient presents to the emergency department with complaints of new vaginal discharge and lower abdominal discomfort as well as pain coming across to low back.  She does have a history of STDs.  No new sexual partners.  She reports her pain has been present for 2 weeks but worsening over the past several days.  Her pain is moderate to severe in severity.  No fevers or chills.  Denies nausea vomiting or diarrhea.  No dysuria or urinary frequency.  No other complaints.   Past Medical History  Diagnosis Date  . Infection     UTI   Past Surgical History  Procedure Laterality Date  . No past surgeries    . Laparoscopic tubal ligation Bilateral 11/18/2013    Procedure: LAPAROSCOPIC BILATERAL TUBAL LIGATION;  Surgeon: Oliver Pila, MD;  Location: [facility];  Service: Gynecology;  Laterality: Bilateral;  fallopian tubes   Family History  Problem Relation Age of Onset  . Hypertension Mother   . Cancer Maternal Aunt     lung  . Hypertension Maternal Grandmother   . Cancer Maternal Grandmother     lung  . Cancer Paternal Grandmother    Social History  Substance Use Topics  . Smoking status: Current Every Day Smoker -- 0.25 packs/day for 4 years    Types: Cigarettes  . Smokeless tobacco: Current User  . Alcohol Use: No   OB History    Gravida Para Term Preterm AB TAB SAB Ectopic Multiple Living   0 0 0 0 0 5     Review of Systems  All other systems reviewed and are negative.     Allergies  Review of patient's allergies indicates no known allergies.  Home Medications   Prior to Admission medications   Medication Sig Start Date End Date Taking? Authorizing Provider  acetaminophen (TYLENOL) 325 MG tablet Take 650-1,000 mg by mouth every 6 (six) hours as needed for mild pain.    Yes Historical Provider, MD  doxycycline (VIBRAMYCIN) 100 MG capsule Take 1 capsule (100 mg total) by mouth 2 (two) times daily. 12/14/15   Azalia Bilis, MD  HYDROcodone-acetaminophen (NORCO/VICODIN) 5-325 MG tablet Take 1 tablet by mouth every 6 (six) hours as needed for moderate pain. 12/14/15   Azalia Bilis, MD  ibuprofen (ADVIL,MOTRIN) 600 MG tablet Take 1 tablet (600 mg total) by mouth every 8 (eight) hours as needed. 12/14/15   Azalia Bilis, MD  metroNIDAZOLE (FLAGYL) 500 MG tablet Take 1 tablet (500 mg total) by mouth 2 (two) times daily. 12/14/15   Azalia Bilis, MD   BP 112/77 mmHg  Pulse 89  Temp(Src) 98.1 F (36.7 C) (Oral)  Resp 16  SpO2 98% Physical Exam  Constitutional: She is oriented to person, place, and time. She appears well-developed and well-nourished. No distress.  HENT:  Head: Normocephalic and atraumatic.  Eyes: EOM are normal.  Neck: Normal range of motion.  Cardiovascular: Normal rate, regular rhythm and normal heart sounds.   Pulmonary/Chest: Effort normal and breath sounds normal.  Abdominal: Soft. She exhibits no distension. There is no tenderness.  Genitourinary:  Cervical motion tenderness with copious cervical discharge and cervical erosions.  Foul-smelling vaginal discharge.  No adnexal masses or fullness.  Musculoskeletal: Normal  range of motion.  Neurological: She is alert and oriented to person, place, and time.  Skin: Skin is warm and dry.  Psychiatric: She has a normal mood and affect. Judgment normal.  Nursing note and vitals reviewed.   ED Course  Procedures (including critical care time) Labs Review Labs Reviewed  COMPREHENSIVE METABOLIC PANEL - Abnormal; Notable for the following:    CO2 21 (*)    All other components within normal limits  URINALYSIS, ROUTINE W REFLEX MICROSCOPIC (NOT AT [facility]) - Abnormal; Notable for the following:    Hgb urine dipstick TRACE (*)    All other components within normal limits  URINE MICROSCOPIC-ADD ON -  Abnormal; Notable for the following:    Squamous Epithelial / LPF 0-5 (*)    Bacteria, UA RARE (*)    All other components within normal limits  WET PREP, GENITAL  LIPASE, BLOOD  CBC  RPR  HIV ANTIBODY (ROUTINE TESTING)  POC URINE PREG, ED  GC/CHLAMYDIA PROBE AMP ([place]) NOT AT [facility]    Imaging Review No results found. I have personally reviewed and evaluated these images and lab results as part of my medical decision-making.   EKG Interpretation None      MDM   Final diagnoses:  Pelvic inflammatory disease (PID)    Patiently treated for pelvic inflammatory disease.  Vital signs are normal.  Pain improving.  Rocephin, doxycycline, Flagyl.  She understands to return to the ER for new or worsening symptoms    Azalia BilisKevin Evella Kasal, MD 12/14/15 [phone]

## 2015-12-14 NOTE — ED Notes (Signed)
Pt. reports intermittent right lateral/low abdominal pain for 2 weeks , denies nausea or vomitting , no fever or diarrhea .

## 2015-12-14 NOTE — Discharge Instructions (Signed)

## 2015-12-17 LAB — GC/CHLAMYDIA PROBE AMP (~~LOC~~) NOT AT ARMC
Chlamydia: NEGATIVE
Neisseria Gonorrhea: POSITIVE — AB

## 2015-12-19 ENCOUNTER — Telehealth (HOSPITAL_COMMUNITY): Payer: Self-pay

## 2015-12-19 NOTE — Telephone Encounter (Signed)
Spoke with pt. Verified ID. Informed of labs. Treated per protocol. [employer] form faxed. Pt informed to abstain from sexual activity x 10 days and to notify partner for testing and treatment.  

## 2016-10-26 ENCOUNTER — Emergency Department (HOSPITAL_COMMUNITY)
Admission: EM | Admit: 2016-10-26 | Discharge: 2016-10-26 | Disposition: A | Discharge status: Home / Self Care | Payer: Medicaid Other | Attending: Emergency Medicine | Admitting: Emergency Medicine

## 2016-10-26 DIAGNOSIS — F439 Reaction to severe stress, unspecified: Secondary | ICD-10-CM | POA: Diagnosis not present

## 2016-10-26 DIAGNOSIS — Z79899 Other long term (current) drug therapy: Secondary | ICD-10-CM | POA: Insufficient documentation

## 2016-10-26 DIAGNOSIS — F419 Anxiety disorder, unspecified: Secondary | ICD-10-CM | POA: Diagnosis present

## 2016-10-26 DIAGNOSIS — F17210 Nicotine dependence, cigarettes, uncomplicated: Secondary | ICD-10-CM | POA: Diagnosis not present

## 2016-10-26 DIAGNOSIS — F430 Acute stress reaction: Secondary | ICD-10-CM
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
## 2017-04-13 ENCOUNTER — Encounter (HOSPITAL_COMMUNITY): Payer: Self-pay

## 2017-04-13 ENCOUNTER — Emergency Department (HOSPITAL_COMMUNITY)
Admission: EM | Admit: 2017-04-13 | Discharge: 2017-04-13 | Disposition: A | Discharge status: Home / Self Care | Payer: Medicaid Other | Attending: Emergency Medicine | Admitting: Emergency Medicine

## 2017-04-13 ENCOUNTER — Emergency Department (HOSPITAL_COMMUNITY): Payer: Medicaid Other

## 2017-04-13 DIAGNOSIS — W1830XA Fall on same level, unspecified, initial encounter: Secondary | ICD-10-CM | POA: Diagnosis not present

## 2017-04-13 DIAGNOSIS — F17210 Nicotine dependence, cigarettes, uncomplicated: Secondary | ICD-10-CM | POA: Diagnosis not present

## 2017-04-13 DIAGNOSIS — Y999 Unspecified external cause status: Secondary | ICD-10-CM | POA: Insufficient documentation

## 2017-04-13 DIAGNOSIS — Y939 Activity, unspecified: Secondary | ICD-10-CM | POA: Insufficient documentation

## 2017-04-13 DIAGNOSIS — S93402A Sprain of unspecified ligament of left ankle, initial encounter: Secondary | ICD-10-CM | POA: Diagnosis not present

## 2017-04-13 DIAGNOSIS — S99912A Unspecified injury of left ankle, initial encounter: Secondary | ICD-10-CM | POA: Diagnosis present

## 2017-04-13 DIAGNOSIS — Y929 Unspecified place or not applicable: Secondary | ICD-10-CM | POA: Diagnosis not present

## 2017-04-13 DIAGNOSIS — Z79899 Other long term (current) drug therapy: Secondary | ICD-10-CM | POA: Diagnosis not present

## 2017-04-13 NOTE — ED Triage Notes (Signed)
PT C/O LEFT ANKLE PAIN SINCE THIS MORNING. PT STS SHE FELL LAST NIGHT, BUT DID NOT FEEL THE EFFECTS UNTIL TODAY. SHE STS SHE IS UNABLE TO BEAR WEIGHT ON THAT FOOT. DENIES ANY OTHER INJURIES.

## 2017-04-13 NOTE — ED Provider Notes (Signed)
[facility] Provider Note   CSN: [id number] Arrival date & time: 04/13/17  1325  By signing my name below, I, Sonum Patel, attest that this documentation has been prepared under the direction and in the presence of Graciella Freer, PA-C. Electronically Signed: Sonum Patel, Neurosurgeon. 04/13/17. 3:00 PM.  History   Chief Complaint Chief Complaint  Patient presents with  . Ankle Pain    LEFT    The history is provided by the patient. No language interpreter was used.     HPI Comments: Selena Kaiser is a 31 y.o. female who presents to the Emergency Department complaining of a left ankle injury that occurred last night after a mechanical fall. Patient does not remember the mechanism of injury to the ankle but did not feel the effects of the injury until waking this morning. She currently complains of constant left ankle pain with associated swelling. She states the pain is worse with ambulation and movement of the affected area. She has taken ibuprofen with some relief; has not tried ice. She denies numbness, weakness, paresthesia, or any other injuries at this time.    Past Medical History:  Diagnosis Date  . Infection    UTI    There are no active problems to display for this patient.   Past Surgical History:  Procedure Laterality Date  . LAPAROSCOPIC TUBAL LIGATION Bilateral 11/18/2013   Procedure: LAPAROSCOPIC BILATERAL TUBAL LIGATION;  Surgeon: Oliver Pila, MD;  Location: [facility];  Service: Gynecology;  Laterality: Bilateral;  fallopian tubes  . NO PAST SURGERIES      OB History    Gravida Para Term Preterm AB Living   0 5   SAB TAB Ectopic Multiple Live Births   0 0 0 0 5       Home Medications    Prior to Admission medications   Medication Sig Start Date End Date Taking? Authorizing Provider  acetaminophen (TYLENOL) 325 MG tablet Take 650-1,000 mg by mouth every 6 (six) hours as needed for mild pain.    Historical Provider, MD  doxycycline  (VIBRAMYCIN) 100 MG capsule Take 1 capsule (100 mg total) by mouth 2 (two) times daily. Patient not taking: Reported on 10/26/2016 12/14/15   Azalia Bilis, MD  HYDROcodone-acetaminophen (NORCO/VICODIN) 5-325 MG tablet Take 1 tablet by mouth every 6 (six) hours as needed for moderate pain. Patient not taking: Reported on 10/26/2016 12/14/15   Azalia Bilis, MD  ibuprofen (ADVIL,MOTRIN) 600 MG tablet Take 1 tablet (600 mg total) by mouth every 8 (eight) hours as needed. Patient not taking: Reported on 10/26/2016 12/14/15   Azalia Bilis, MD  metroNIDAZOLE (FLAGYL) 500 MG tablet Take 1 tablet (500 mg total) by mouth 2 (two) times daily. Patient not taking: Reported on 10/26/2016 12/14/15   Azalia Bilis, MD    Family History Family History  Problem Relation Age of Onset  . Cancer Maternal Aunt     lung  . Cancer Paternal Grandmother   . Hypertension Mother   . Hypertension Maternal Grandmother   . Cancer Maternal Grandmother     lung    Social History Social History  Substance Use Topics  . Smoking status: Current Every Day Smoker    Packs/day: 0.25    Years: 4.00    Types: Cigarettes  . Smokeless tobacco: Current User  . Alcohol use Yes     Comment: OCCASIONAL     Allergies   Patient has no known allergies.   Review of  Systems Review of Systems  Constitutional: Negative for fever.  Gastrointestinal: Negative for vomiting.  Musculoskeletal: Positive for arthralgias and joint swelling.  Neurological: Negative for weakness and numbness.  All other systems reviewed and are negative.    Physical Exam Updated Vital Signs BP 135/77 (BP Location: Right Arm)   Pulse 83   Temp 97.9 F (36.6 C) (Oral)   Resp 16   Ht  (1.676 m)   Wt 56.7 kg   LMP 04/07/2017   SpO2 98%   BMI 20.18 kg/m   Physical Exam  Constitutional: She appears well-developed and well-nourished.  HENT:  Head: Normocephalic and atraumatic.  Eyes: Conjunctivae and EOM are normal. Right eye  exhibits no discharge. Left eye exhibits no discharge. No scleral icterus.  Pulmonary/Chest: Effort normal.  Musculoskeletal: She exhibits no deformity.  Right ankle with FROM and no abnormality. Left ankle with slight soft tissue swelling to the lateral malleolus. Tenderness to palpation of the lateral malleolus. No metasarsal tenderness. Dorsi/plantar flexion fully intact bilaterally. FROM of left toes. No tenderness to palpation of the left distal tib/fib.   Neurological: She is alert.  Skin: Skin is warm and dry.  Psychiatric: She has a normal mood and affect. Her speech is normal and behavior is normal.  Nursing note and vitals reviewed.    ED Treatments / Results  DIAGNOSTIC STUDIES: Oxygen Saturation is 98% on RA, normal by my interpretation.    COORDINATION OF CARE: 2:58 PM Discussed treatment plan with pt at bedside and pt agreed to plan.   Labs (all labs ordered are listed, but only abnormal results are displayed) Labs Reviewed - No data to display  EKG  EKG Interpretation None       Radiology Dg Ankle Complete Left  Result Date: 04/13/2017 CLINICAL DATA:  Larey Seat last night with lateral ankle pain. EXAM: LEFT ANKLE COMPLETE - 3+ VIEW COMPARISON:  Left foot 04/13/2017 FINDINGS: Negative for fracture or dislocation. Alignment of the left ankle is normal. Question mild soft tissue swelling. IMPRESSION: No acute bone abnormality in left ankle. Electronically Signed   By: Richarda Overlie M.D.   On: 04/13/2017 14:15   Dg Foot Complete Left  Result Date: 04/13/2017 CLINICAL DATA:  Left lateral ankle pain. EXAM: LEFT FOOT - COMPLETE 3+ VIEW COMPARISON:  None. FINDINGS: There is no evidence of fracture or dislocation. There is no evidence of arthropathy or other focal bone abnormality. Soft tissues are unremarkable. IMPRESSION: Negative. Electronically Signed   By: Ted Mcalpine M.D.   On: 04/13/2017 14:14    Procedures Procedures (including critical care time)  Medications  Ordered in ED Medications - No data to display   Initial Impression / Assessment and Plan / ED Course  I have reviewed the triage vital signs and the nursing notes.  Pertinent labs & imaging results that were available during my care of the patient were reviewed by me and considered in my medical decision making (see chart for details).    31 year old female who presents with left ankle pain that began last night after mechanical fall. Physical exam with some mild soft tissue swelling and tenderness to the lateral malleolus. Patient is neurovascularly intact. Consider fracture versus sprain. X-rays obtained to eval for fracture or dislocation.  X-ray reviewed. Negative for any obvious acute fracture dislocation. Pt advised to follow up with orthopedics. Patient given splint while in ED, conservative therapy recommended and discussed. Patient stable for discharge at this time. Return precautions discussed. Patient expresses understanding and agreement  to plan.   Final Clinical Impressions(s) / ED Diagnoses   Final diagnoses:  Sprain of left ankle, unspecified ligament, initial encounter    New Prescriptions Discharge Medication List as of 04/13/2017  3:07 PM    I personally performed the services described in this documentation, which was scribed in my presence. The recorded information has been reviewed and is accurate.    Maxwell Caul, PA-C 04/14/17 1015    Lorre Nick, MD 04/17/17 [phone]

## 2017-04-13 NOTE — Discharge Instructions (Signed)
Her x-ray today showed no fracture. As we discussed this could be a ligament injury or fracture that is not showing today. He continue having worsening pain and do not improve feet, orthopedic doctor that is referred above to arrange for follow-up appointment.  Take Tylenol for pain.  Ankle sprain  Your caregiver has diagnosed you as suffering from an ankle sprain. Ankle sprain occurs when the ligaments that hold the ankle joint to get her are stretched or torn. It may take 4-6 weeks to heal.  For activity: Use crutches with nonweightbearing for the first few days. Then, you may walk on your ankles as the pain allows, or as instructed. Start gradually with weight bearing on the affected ankle. Once you can walk pain free, then try jogging. When you can run forwards, then you can try moving side to side. If you cannot walk without crutches in one week, you need a recheck by your Family Doctor.  Seek immediate medical attention if: You're toes are numb or tingling, appear gray or blue, or you have severe pain (also elevate the leg and loosen the splint if this occurs)  If you do not have a family doctor to followup with, you can see the list of phone numbers below. Please call today to make a followup appointment.   RICE therapy:  Routine Care for injuries  Rest, Ice, Compression, Elevation (RICE)  Rest is needed to allow your body to heal. Routine activities can be resumed when comfortable. Injury tendons and bones can take up to 6 weeks to heal. Tendons are cordlike structures that attach muscles and bones.  Ice following an injury helps keep the swelling down and reduce the pain. Put ice in a plastic bag. Place a towel between your skin and the bag of ice. Leave the ice on for 15-20 minutes, 3-4 times a day. Do this while awake, for the first 24-48 hours. After that continue as directed by your caregiver.  Compression helps keep swelling down. It also gives support and helps with discomfort.  If any lasting bandage has been applied, it should be removed and reapplied every 3-4 hours. It should not be applied tightly, but firmly enough to keep swelling down. Watch fingers or toes for swelling, discoloration, coldness, numbness or excessive pain. If any of these problems occur, removed the bandage and reapply loosely. Contact your caregiver if these problems continue.  Elevation helps reduce swelling and decrease your pain. With extremities such as the arms, hands, legs and feet, the injured area should be placed near or above the level of the heart if possible.  Seek immediate medical care if you have persistent pain and swelling, developed redness numbness or unexpected weakness, your symptoms are getting worse rather than improving after several days. The symptoms may indicate that further evaluation or further x-rays are needed. Sometimes, x-rays may not show a small broken bone until one week or 10 days later. Make a followup appointment with your caregiver. Ask when your x-ray results will be ready. Make sure you get your x-ray results

## 2017-04-13 NOTE — ED Notes (Signed)
PT DISCHARGED. INSTRUCTIONS GIVEN. AAOX4. PT IN NO APPARENT DISTRESS. THE OPPORTUNITY TO ASK QUESTIONS WAS PROVIDED. 

## 2017-08-23 ENCOUNTER — Encounter (HOSPITAL_COMMUNITY): Payer: Self-pay

## 2017-08-23 ENCOUNTER — Emergency Department (HOSPITAL_COMMUNITY)
Admission: EM | Admit: 2017-08-23 | Discharge: 2017-08-23 | Disposition: A | Discharge status: Home / Self Care | Payer: Medicaid Other | Attending: Emergency Medicine | Admitting: Emergency Medicine

## 2017-08-23 DIAGNOSIS — R21 Rash and other nonspecific skin eruption: Secondary | ICD-10-CM | POA: Diagnosis present

## 2017-08-23 DIAGNOSIS — Z79899 Other long term (current) drug therapy: Secondary | ICD-10-CM | POA: Diagnosis not present

## 2017-08-23 DIAGNOSIS — F17210 Nicotine dependence, cigarettes, uncomplicated: Secondary | ICD-10-CM | POA: Insufficient documentation

## 2017-08-23 DIAGNOSIS — B029 Zoster without complications: Secondary | ICD-10-CM | POA: Diagnosis not present

## 2017-08-23 MED ORDER — ACYCLOVIR 800 MG PO TABS: 800.0000 mg | ORAL_TABLET | Freq: Every day | ORAL | 0 refills | Status: AC

## 2017-08-23 NOTE — ED Triage Notes (Signed)
Patient c/o insect bite to the right shoulder area. Patient c/o itching and sore that radiates to the right axilla.

## 2017-08-23 NOTE — ED Notes (Signed)
Pt reports noticing a what look like a mosquito bite yesterday, it was red and itchy.  Today it started to "tingle in the middle" and rash came up.  Thought it was a spider bite.

## 2017-08-23 NOTE — Discharge Instructions (Signed)

## 2017-08-23 NOTE — ED Provider Notes (Signed)
[facility] Provider Note   CSN: [id number] Arrival date & time: 08/23/17  1657     History   Chief Complaint Chief Complaint  Patient presents with  . Insect Bite    HPI Selena Kaiser is a 31 y.o. female who presents for a suspected bug bite on her right posterior shoulder.  She did not actually see a bug there.  Yesterday it was red, itchy, painful and pain started to radiate around her trunk.  Today it started to "tingle" and she was told that bumps showed up.  She reports that she had chickenpox as a kid.   HPI  Past Medical History:  Diagnosis Date  . Infection    UTI    There are no active problems to display for this patient.   Past Surgical History:  Procedure Laterality Date  . LAPAROSCOPIC TUBAL LIGATION Bilateral 11/18/2013   Procedure: LAPAROSCOPIC BILATERAL TUBAL LIGATION;  Surgeon: Oliver Pila, MD;  Location: [facility];  Service: Gynecology;  Laterality: Bilateral;  fallopian tubes  . NO PAST SURGERIES      OB History    Gravida Para Term Preterm AB Living   0 5   SAB TAB Ectopic Multiple Live Births   0 0 0 0 5       Home Medications    Prior to Admission medications   Medication Sig Start Date End Date Taking? Authorizing Provider  acetaminophen (TYLENOL) 325 MG tablet Take 650-1,000 mg by mouth every 6 (six) hours as needed for mild pain.    [provider]  acyclovir (ZOVIRAX) 800 MG tablet Take 1 tablet (800 mg total) by mouth 5 (five) times daily. 08/23/17 09/02/17  Cristina Gong, PA-C  doxycycline (VIBRAMYCIN) 100 MG capsule Take 1 capsule (100 mg total) by mouth 2 (two) times daily. Patient not taking: Reported on 10/26/2016 12/14/15   Azalia Bilis, MD  HYDROcodone-acetaminophen (NORCO/VICODIN) 5-325 MG tablet Take 1 tablet by mouth every 6 (six) hours as needed for moderate pain. Patient not taking: Reported on 10/26/2016 12/14/15   Azalia Bilis, MD  ibuprofen (ADVIL,MOTRIN) 600 MG tablet Take 1 tablet  (600 mg total) by mouth every 8 (eight) hours as needed. Patient not taking: Reported on 10/26/2016 12/14/15   Azalia Bilis, MD  metroNIDAZOLE (FLAGYL) 500 MG tablet Take 1 tablet (500 mg total) by mouth 2 (two) times daily. Patient not taking: Reported on 10/26/2016 12/14/15   Azalia Bilis, MD    Family History Family History  Problem Relation Age of Onset  . Cancer Maternal Aunt        lung  . Cancer Paternal Grandmother   . Hypertension Mother   . Hypertension Maternal Grandmother   . Cancer Maternal Grandmother        lung    Social History Social History  Substance Use Topics  . Smoking status: Current Every Day Smoker    Packs/day: 0.10    Years: 4.00    Types: Cigarettes  . Smokeless tobacco: Never Used  . Alcohol use Yes     Comment: OCCASIONAL     Allergies   Patient has no known allergies.   Review of Systems Review of Systems  Constitutional: Negative for chills and fever.  Musculoskeletal: Negative for neck stiffness.  Skin: Positive for color change and rash. Negative for pallor and wound.  Neurological: Negative for headaches.     Physical Exam Updated Vital Signs BP 129/78 (BP Location: Left Arm)   Pulse  95   Temp 98.8 F (37.1 C) (Oral)   Resp 18   Ht 5\' 6"  (1.676 m)   Wt 55.8 kg (123 lb)   LMP 08/22/2017   SpO2 100%   BMI 19.85 kg/m   Physical Exam  Constitutional: She is oriented to person, place, and time. She appears well-developed and well-nourished.  HENT:  Head: Normocephalic and atraumatic.  Eyes: Pupils are equal, round, and reactive to light. EOM are normal.  Neck: Normal range of motion.  Cardiovascular: Intact distal pulses.   Pulmonary/Chest: Effort normal.  Lymphadenopathy:    She has no cervical adenopathy.  Neurological: She is alert and oriented to person, place, and time.  Skin: She is not diaphoretic.  There is a 5x5 cm erythematous patch on the posterior aspect of right shoulder, near the inferior tip of  scapula.  There are multiple small clear vesicles present in the middle.  There are three erythematous areas along right posterior arm, each about 2.5cm diameter with no vesicles present.  No obvious lesions or erythema in axilla.   There are no lesions over face, neck, around ears or on left side of body.    Nursing note and vitals reviewed.    ED Treatments / Results  Labs (all labs ordered are listed, but only abnormal results are displayed) Labs Reviewed - No data to display  EKG  EKG Interpretation None       Radiology No results found.  Procedures Procedures (including critical care time)  Medications Ordered in ED Medications - No data to display   Initial Impression / Assessment and Plan / ED Course  I have reviewed the triage vital signs and the nursing notes.  Pertinent labs & imaging results that were available during my care of the patient were reviewed by me and considered in my medical decision making (see chart for details).    Rash is tender with grouped clear vesicles on an erythematous base located in a dermatomal pattern unilaterally on the right.  Pt without signs of CNS involvement and there is no involvement of the face or eyes; no concern for opthalmic zoster.  Will discharge home with pain management and acyclovir.  Also recommend calamine lotion and cool compresses as needed for pain control.  Pt has hx of tubal ligation, no concern for pregnancy.  She was advised she is contagious.    Final Clinical Impressions(s) / ED Diagnoses   Final diagnoses:  Herpes zoster without complication    New Prescriptions New Prescriptions   ACYCLOVIR (ZOVIRAX) 800 MG TABLET    Take 1 tablet (800 mg total) by mouth 5 (five) times daily.     Cristina GongHammond, Kabir Brannock W, PA-C 08/25/17 1116    Nira Connardama, Pedro Eduardo, MD 08/27/17 0111

## 2017-12-26 ENCOUNTER — Ambulatory Visit (HOSPITAL_COMMUNITY)
Admission: EM | Admit: 2017-12-26 | Discharge: 2017-12-26 | Disposition: A | Discharge status: Home / Self Care | Payer: Medicaid Other | Attending: Family Medicine | Admitting: Family Medicine

## 2017-12-26 ENCOUNTER — Encounter (HOSPITAL_COMMUNITY): Payer: Self-pay | Admitting: Emergency Medicine

## 2017-12-26 DIAGNOSIS — J019 Acute sinusitis, unspecified: Secondary | ICD-10-CM

## 2017-12-26 MED ORDER — AMOXICILLIN-POT CLAVULANATE 875-125 MG PO TABS: 1.0000 | ORAL_TABLET | Freq: Two times a day (BID) | ORAL | 0 refills | Status: AC

## 2017-12-26 MED ORDER — GUAIFENESIN-DM 100-10 MG/5ML PO SYRP: 5.0000 mL | ORAL_SOLUTION | ORAL | 0 refills | Status: AC | PRN

## 2017-12-26 NOTE — ED Triage Notes (Signed)
PT C/O: cold sx onse 1 week associated w/dry cough, CP due to cough, rib pain due to cough, nasal drainage/congestion  DENIES: fevers  TAKING MEDS: none   A&O x4... NAD... Ambulatory

## 2017-12-26 NOTE — ED Provider Notes (Signed)
[facility]    CSN: [id number] Arrival date & time: 12/26/17  1328     History   Chief Complaint Chief Complaint  Patient presents with  . URI    HPI Selena Kaiser is a 32 y.o. female Patient is presenting with URI symptoms- congestion, cough, sore throat. Patient's main complaints are cough/ associated chest/rib pain. Symptoms have been going on for over 1 week. Patient has not tried any over the counter medicines. Denies fever, nausea, vomiting, diarrhea. Denies shortness of breath and chest pain.    HPI  Past Medical History:  Diagnosis Date  . Infection    UTI    There are no active problems to display for this patient.   Past Surgical History:  Procedure Laterality Date  . LAPAROSCOPIC TUBAL LIGATION Bilateral 11/18/2013   Procedure: LAPAROSCOPIC BILATERAL TUBAL LIGATION;  Surgeon: Oliver Pila, MD;  Location: [facility];  Service: Gynecology;  Laterality: Bilateral;  fallopian tubes  . NO PAST SURGERIES      OB History    Gravida Para Term Preterm AB Living   5 5 2 3  0 5   SAB TAB Ectopic Multiple Live Births   0 0 0 0 5       Home Medications    Prior to Admission medications   Medication Sig Start Date End Date Taking? Authorizing Provider  acetaminophen (TYLENOL) 325 MG tablet Take 650-1,000 mg by mouth every 6 (six) hours as needed for mild pain.    [provider]  amoxicillin-clavulanate (AUGMENTIN) 875-125 MG tablet Take 1 tablet by mouth every 12 (twelve) hours for 7 days. 12/26/17 01/02/18  Rudell Ortman C, PA-C  doxycycline (VIBRAMYCIN) 100 MG capsule Take 1 capsule (100 mg total) by mouth 2 (two) times daily. Patient not taking: Reported on 10/26/2016 12/14/15   Azalia Bilis, MD  guaiFENesin-dextromethorphan [facility] DM) 100-10 MG/5ML syrup Take 5 mLs by mouth every 4 (four) hours as needed for up to 5 days for cough. 12/26/17 12/31/17  Dayyan Krist C, PA-C  HYDROcodone-acetaminophen (NORCO/VICODIN) 5-325 MG tablet  Take 1 tablet by mouth every 6 (six) hours as needed for moderate pain. Patient not taking: Reported on 10/26/2016 12/14/15   Azalia Bilis, MD  ibuprofen (ADVIL,MOTRIN) 600 MG tablet Take 1 tablet (600 mg total) by mouth every 8 (eight) hours as needed. Patient not taking: Reported on 10/26/2016 12/14/15   Azalia Bilis, MD  metroNIDAZOLE (FLAGYL) 500 MG tablet Take 1 tablet (500 mg total) by mouth 2 (two) times daily. Patient not taking: Reported on 10/26/2016 12/14/15   Azalia Bilis, MD    Family History Family History  Problem Relation Age of Onset  . Cancer Maternal Aunt        lung  . Cancer Paternal Grandmother   . Hypertension Mother   . Hypertension Maternal Grandmother   . Cancer Maternal Grandmother        lung    Social History Social History   Tobacco Use  . Smoking status: Current Every Day Smoker    Packs/day: 0.10    Years: 4.00    Pack years: 0.40    Types: Cigarettes  . Smokeless tobacco: Never Used  Substance Use Topics  . Alcohol use: Yes    Comment: OCCASIONAL  . Drug use: No     Allergies   Patient has no known allergies.   Review of Systems Review of Systems  Constitutional: Negative for chills, fatigue and fever.  HENT: Positive for congestion and rhinorrhea.  Negative for ear pain, sinus pressure, sore throat and trouble swallowing.   Respiratory: Positive for cough and chest tightness. Negative for shortness of breath.   Cardiovascular: Positive for chest pain.  Gastrointestinal: Negative for abdominal pain, nausea and vomiting.  Musculoskeletal: Negative for myalgias.  Skin: Negative for rash.  Neurological: Negative for dizziness, light-headedness and headaches.     Physical Exam Triage Vital Signs ED Triage Vitals [12/26/17 1414]  Enc Vitals Group     BP 120/80     Pulse Rate 66     Resp 20     Temp 98.1 F (36.7 C)     Temp Source Oral     SpO2 100 %     Weight      Height      Head Circumference      Peak Flow       Pain Score      Pain Loc      Pain Edu?      Excl. in GC?    No data found.  Updated Vital Signs BP 120/80 (BP Location: Left Arm)   Pulse 66   Temp 98.1 F (36.7 C) (Oral)   Resp 20   LMP 11/28/2017   SpO2 100%    Physical Exam  Constitutional: She is oriented to person, place, and time. She appears well-developed and well-nourished. No distress.  HENT:  Head: Normocephalic and atraumatic.  Right Ear: Tympanic membrane and ear canal normal. Tympanic membrane is not erythematous.  Left Ear: Tympanic membrane and ear canal normal. Tympanic membrane is not erythematous.  Nose: Nose normal.  Mouth/Throat: Uvula is midline and mucous membranes are normal. No oral lesions. No trismus in the jaw. No uvula swelling. Posterior oropharyngeal erythema present. Tonsils are 1+ on the right. Tonsils are 1+ on the left. No tonsillar exudate.  Non tenderness to maxillary or frontal sinuses  Eyes: Conjunctivae are normal. Right eye exhibits no chemosis. Left eye exhibits no chemosis. Right conjunctiva is not injected. Left conjunctiva is not injected.  Neck: Neck supple.  Cardiovascular: Normal rate and regular rhythm.  No murmur heard. Pulmonary/Chest: Effort normal and breath sounds normal. No respiratory distress. She has no wheezes. She has no rales.  Abdominal: Soft. There is no tenderness.  Musculoskeletal: She exhibits no edema.  Neurological: She is alert and oriented to person, place, and time.  Skin: Skin is warm and dry.  Psychiatric: She has a normal mood and affect.  Nursing note and vitals reviewed.    UC Treatments / Results  Labs (all labs ordered are listed, but only abnormal results are displayed) Labs Reviewed - No data to display  EKG  EKG Interpretation None       Radiology No results found.  Procedures Procedures (including critical care time)  Medications Ordered in UC Medications - No data to display   Initial Impression / Assessment and Plan / UC  Course  I have reviewed the triage vital signs and the nursing notes.  Pertinent labs & imaging results that were available during my care of the patient were reviewed by me and considered in my medical decision making (see chart for details).     Patient presents with symptoms likely from a viral upper respiratory infection vs. Sinusitis. No fever, 100%, lungs CTABL. Differential includes sinusitis, allergic rhinitis, viral URI. Do not suspect underlying cardiopulmonary process. Symptoms seem unlikely related to ACS, CHF or COPD exacerbations, pneumonia, pneumothorax. Patient is nontoxic appearing and not in need of emergent  medical intervention.  Given length of symptoms will treat with Augmentin. Cough syrup for cough. Discussed further OTC treatment to control symptoms.  Recommended symptom control with over the counter medications: Daily oral anti-histamine, Oral decongestant or IN corticosteroid, saline irrigations, cepacol lozenges, Robitussin, Delsym, honey tea.  Return if symptoms fail to improve in 1-2 weeks or you develop shortness of breath, chest pain, severe headache. Patient states understanding and is agreeable. Discussed strict return precautions. Patient verbalized understanding and is agreeable with plan.   Final Clinical Impressions(s) / UC Diagnoses   Final diagnoses:  Acute sinusitis with symptoms > 10 days    ED Discharge Orders        Ordered    amoxicillin-clavulanate (AUGMENTIN) 875-125 MG tablet  Every 12 hours     12/26/17 1441    guaiFENesin-dextromethorphan (ROBITUSSIN DM) 100-10 MG/5ML syrup  Every 4 hours PRN     12/26/17 1442       Controlled Substance Prescriptions [place] Controlled Substance Registry consulted? Not Applicable   Lew Dawes, [place] 12/26/17 1453

## 2017-12-26 NOTE — Discharge Instructions (Signed)
We are treating you with Augmentin- please take twice daily for 7 days.   For congestion please use over the counter zyrtec/claritin or store brand daily- take this consistently for 2 weeks, will not see an effect until 4-5 days. May also use flonase for congestion.   For cough you may try the prescribed syrup.   Please take ibuprofen or Tylenol for pain in chest with coughing, it is common to strain the muscles in the rib cage from persistent coughing.   Drink honey tea to help with cough and throat irritation.   Stay Hydrated!

## 2020-09-07 ENCOUNTER — Other Ambulatory Visit: Payer: Self-pay

## 2020-09-07 ENCOUNTER — Ambulatory Visit (HOSPITAL_COMMUNITY)
Admission: EM | Admit: 2020-09-07 | Discharge: 2020-09-07 | Disposition: A | Discharge status: Home / Self Care | Payer: Medicaid Other | Attending: Emergency Medicine | Admitting: Emergency Medicine

## 2020-09-07 ENCOUNTER — Encounter (HOSPITAL_COMMUNITY): Payer: Self-pay

## 2020-09-07 DIAGNOSIS — F17210 Nicotine dependence, cigarettes, uncomplicated: Secondary | ICD-10-CM | POA: Diagnosis not present

## 2020-09-07 DIAGNOSIS — R519 Headache, unspecified: Secondary | ICD-10-CM | POA: Diagnosis present

## 2020-09-07 DIAGNOSIS — Z20822 Contact with and (suspected) exposure to covid-19: Secondary | ICD-10-CM | POA: Insufficient documentation

## 2020-09-07 LAB — SARS CORONAVIRUS 2 (TAT 6-24 HRS): SARS Coronavirus 2: NEGATIVE

## 2020-09-07 MED ORDER — ONDANSETRON 4 MG PO TBDP
4.0000 mg | ORAL_TABLET | Freq: Once | ORAL | Status: AC
  Administered 2020-09-07: 4 mg via ORAL

## 2020-09-07 MED ORDER — KETOROLAC TROMETHAMINE 60 MG/2ML IM SOLN
60.0000 mg | Freq: Once | INTRAMUSCULAR | Status: AC
  Administered 2020-09-07: 60 mg via INTRAMUSCULAR

## 2020-09-07 MED ORDER — ONDANSETRON 4 MG PO TBDP
ORAL_TABLET | ORAL | Status: AC
  Filled 2020-09-07: qty 1

## 2020-09-07 MED ORDER — KETOROLAC TROMETHAMINE 60 MG/2ML IM SOLN
INTRAMUSCULAR | Status: AC
  Filled 2020-09-07: qty 2

## 2020-09-07 NOTE — Discharge Instructions (Signed)
May take Benadryl as well to promote sleep and help with headache.  Go home, rest in quiet dark room. Limit screen time.  Drink plenty of water to ensure adequate hydration.

## 2020-09-07 NOTE — ED Provider Notes (Signed)
[facility]    CSN: [id number] Arrival date & time: 09/07/20  1403      History   Chief Complaint Chief Complaint  Patient presents with  . Headache  . Nausea    HPI Selena Kaiser is a 34 y.o. female.   Selena Kaiser presents with complaints of headache which started two days ago. Constant since. advil helps some but doesn't completely abort. Last night vomited due to pain. No further vomiting. Has had headaches in the past, migraines, which were similar. No other URI symptoms. Temp of 99.5 two days ago, no further fever. No stomach pain. Photophobia. took ibuprofen last this morning. No known ill contacts. No history of covid-19 and has not received vaccination. No head injury.    ROS per HPI, negative if not otherwise mentioned.      Past Medical History:  Diagnosis Date  . Infection    UTI    There are no problems to display for this patient.   Past Surgical History:  Procedure Laterality Date  . LAPAROSCOPIC TUBAL LIGATION Bilateral 11/18/2013   Procedure: LAPAROSCOPIC BILATERAL TUBAL LIGATION;  Surgeon: Oliver Pila, MD;  Location: [facility];  Service: Gynecology;  Laterality: Bilateral;  fallopian tubes  . NO PAST SURGERIES      OB History    Gravida  5   Para  5   Term  2   Preterm  3   AB  0   Living  5     SAB  0   TAB  0   Ectopic  0   Multiple  0   Live Births  5            Home Medications    Prior to Admission medications   Medication Sig Start Date End Date Taking? Authorizing Provider  ibuprofen (ADVIL) 200 MG tablet Take 200 mg by mouth every 6 (six) hours as needed.   Yes [provider]  acetaminophen (TYLENOL) 325 MG tablet Take 650-1,000 mg by mouth every 6 (six) hours as needed for mild pain.    [provider]  doxycycline (VIBRAMYCIN) 100 MG capsule Take 1 capsule (100 mg total) by mouth 2 (two) times daily. Patient not taking: Reported on 10/26/2016 12/14/15   Azalia Bilis, MD   HYDROcodone-acetaminophen (NORCO/VICODIN) 5-325 MG tablet Take 1 tablet by mouth every 6 (six) hours as needed for moderate pain. Patient not taking: Reported on 10/26/2016 12/14/15   Azalia Bilis, MD  ibuprofen (ADVIL,MOTRIN) 600 MG tablet Take 1 tablet (600 mg total) by mouth every 8 (eight) hours as needed. Patient not taking: Reported on 10/26/2016 12/14/15   Azalia Bilis, MD  metroNIDAZOLE (FLAGYL) 500 MG tablet Take 1 tablet (500 mg total) by mouth 2 (two) times daily. Patient not taking: Reported on 10/26/2016 12/14/15   Azalia Bilis, MD    Family History Family History  Problem Relation Age of Onset  . Cancer Maternal Aunt        lung  . Cancer Paternal Grandmother   . Hypertension Mother   . Hypertension Maternal Grandmother   . Cancer Maternal Grandmother        lung    Social History Social History   Tobacco Use  . Smoking status: Current Every Day Smoker    Packs/day: 0.10    Years: 4.00    Pack years: 0.40    Types: Cigarettes  . Smokeless tobacco: Never Used  Vaping Use  . Vaping Use: Never used  Substance Use Topics  . Alcohol use: Yes    Comment: OCCASIONAL  . Drug use: No     Allergies   Patient has no known allergies.   Review of Systems Review of Systems   Physical Exam Triage Vital Signs ED Triage Vitals  Enc Vitals Group     BP 09/07/20 1718 (!) 140/92     Pulse Rate 09/07/20 1718 73     Resp 09/07/20 1718 16     Temp 09/07/20 1718 98.6 F (37 C)     Temp Source 09/07/20 1718 Oral     SpO2 09/07/20 1718 99 %     Weight --      Height --      Head Circumference --      Peak Flow --      Pain Score 09/07/20 1716 8     Pain Loc --      Pain Edu? --      Excl. in GC? --    No data found.  Updated Vital Signs BP (!) 140/92 (BP Location: Right Arm)   Pulse 73   Temp 98.6 F (37 C) (Oral)   Resp 16   LMP 09/03/2020 (Exact Date)   SpO2 99%   Visual Acuity Right Eye Distance:   Left Eye Distance:   Bilateral Distance:      Right Eye Near:   Left Eye Near:    Bilateral Near:     Physical Exam Constitutional:      General: She is not in acute distress.    Appearance: She is well-developed.  Eyes:     Extraocular Movements: Extraocular movements intact.     Pupils: Pupils are equal, round, and reactive to light.  Cardiovascular:     Rate and Rhythm: Normal rate.  Pulmonary:     Effort: Pulmonary effort is normal.  Musculoskeletal:     Cervical back: Normal range of motion.  Skin:    General: Skin is warm and dry.  Neurological:     Mental Status: She is alert and oriented to person, place, and time.     Cranial Nerves: No cranial nerve deficit.     Sensory: No sensory deficit.  Psychiatric:        Mood and Affect: Mood normal.        Behavior: Behavior normal.      UC Treatments / Results  Labs (all labs ordered are listed, but only abnormal results are displayed) Labs Reviewed  SARS CORONAVIRUS 2 (TAT 6-24 HRS)    EKG   Radiology No results found.  Procedures Procedures (including critical care time)  Medications Ordered in UC Medications  ketorolac (TORADOL) injection 60 mg (60 mg Intramuscular Given 09/07/20 1756)  ondansetron (ZOFRAN-ODT) disintegrating tablet 4 mg (4 mg Oral Given 09/07/20 1756)    Initial Impression / Assessment and Plan / UC Course  I have reviewed the triage vital signs and the nursing notes.  Pertinent labs & imaging results that were available during my care of the patient were reviewed by me and considered in my medical decision making (see chart for details).     No red flag findings. Similar to previous migraines. Migraine cocktail provided tonight. Return precautions provided. Patient verbalized understanding and agreeable to plan.   Final Clinical Impressions(s) / UC Diagnoses   Final diagnoses:  Bad headache     Discharge Instructions     May take Benadryl as well to promote sleep and help with headache.  Go  home, rest in quiet dark  room. Limit screen time.  Drink plenty of water to ensure adequate hydration.       ED Prescriptions    None     PDMP not reviewed this encounter.   Georgetta Haber, NP 09/07/20 1913

## 2020-09-07 NOTE — ED Triage Notes (Signed)
Pt presents with headache, nausea and vomiting x  2 days. States Advil gives somewhat relief.
